# Patient Record
Sex: Female | Born: 1970 | Race: White | Hispanic: No | Smoking: Never smoker
Health system: Southern US, Community
[De-identification: ages and names within clinical notes are randomized; demographics above are authoritative.]

## PROBLEM LIST (undated history)

## (undated) DIAGNOSIS — K219 Gastro-esophageal reflux disease without esophagitis: Secondary | ICD-10-CM

## (undated) DIAGNOSIS — F329 Major depressive disorder, single episode, unspecified: Secondary | ICD-10-CM

## (undated) DIAGNOSIS — F32A Depression, unspecified: Secondary | ICD-10-CM

## (undated) DIAGNOSIS — F419 Anxiety disorder, unspecified: Secondary | ICD-10-CM

## (undated) DIAGNOSIS — Z87442 Personal history of urinary calculi: Secondary | ICD-10-CM

## (undated) HISTORY — PX: EXTRACORPOREAL SHOCK WAVE LITHOTRIPSY: SHX1557

## (undated) HISTORY — PX: CHOLECYSTECTOMY: SHX55

---

## 1998-04-13 ENCOUNTER — Other Ambulatory Visit: Admission: RE | Admit: 1998-04-13 | Discharge: 1998-04-13 | Payer: Self-pay | Admitting: Obstetrics and Gynecology

## 1999-01-23 ENCOUNTER — Inpatient Hospital Stay (HOSPITAL_COMMUNITY): Admission: AD | Admit: 1999-01-23 | Discharge: 1999-01-23 | Payer: Self-pay | Admitting: *Deleted

## 1999-01-25 ENCOUNTER — Inpatient Hospital Stay (HOSPITAL_COMMUNITY): Admission: AD | Admit: 1999-01-25 | Discharge: 1999-01-25 | Payer: Self-pay | Admitting: Obstetrics and Gynecology

## 1999-02-08 ENCOUNTER — Observation Stay (HOSPITAL_COMMUNITY): Admission: AD | Admit: 1999-02-08 | Discharge: 1999-02-08 | Payer: Self-pay | Admitting: Obstetrics and Gynecology

## 1999-02-14 ENCOUNTER — Observation Stay (HOSPITAL_COMMUNITY): Admission: AD | Admit: 1999-02-14 | Discharge: 1999-02-15 | Payer: Self-pay | Admitting: Obstetrics and Gynecology

## 1999-02-23 ENCOUNTER — Other Ambulatory Visit: Admission: RE | Admit: 1999-02-23 | Discharge: 1999-02-23 | Payer: Self-pay | Admitting: Obstetrics and Gynecology

## 1999-06-28 ENCOUNTER — Encounter (HOSPITAL_COMMUNITY): Admission: RE | Admit: 1999-06-28 | Discharge: 1999-07-11 | Payer: Self-pay | Admitting: Obstetrics and Gynecology

## 1999-07-10 ENCOUNTER — Inpatient Hospital Stay (HOSPITAL_COMMUNITY): Admission: AD | Admit: 1999-07-10 | Discharge: 1999-07-15 | Payer: Self-pay | Admitting: Obstetrics and Gynecology

## 1999-07-10 ENCOUNTER — Encounter: Payer: Self-pay | Admitting: Obstetrics and Gynecology

## 1999-07-10 ENCOUNTER — Encounter (INDEPENDENT_AMBULATORY_CARE_PROVIDER_SITE_OTHER): Payer: Self-pay

## 1999-08-16 ENCOUNTER — Other Ambulatory Visit: Admission: RE | Admit: 1999-08-16 | Discharge: 1999-08-16 | Payer: Self-pay | Admitting: Obstetrics and Gynecology

## 2001-10-18 ENCOUNTER — Other Ambulatory Visit: Admission: RE | Admit: 2001-10-18 | Discharge: 2001-10-18 | Payer: Self-pay | Admitting: Obstetrics and Gynecology

## 2004-01-01 ENCOUNTER — Observation Stay (HOSPITAL_COMMUNITY): Admission: RE | Admit: 2004-01-01 | Discharge: 2004-01-02 | Payer: Self-pay | Admitting: Surgery

## 2004-01-01 ENCOUNTER — Encounter (INDEPENDENT_AMBULATORY_CARE_PROVIDER_SITE_OTHER): Payer: Self-pay | Admitting: Specialist

## 2004-01-17 ENCOUNTER — Encounter: Payer: Self-pay | Admitting: Emergency Medicine

## 2004-01-18 ENCOUNTER — Inpatient Hospital Stay (HOSPITAL_COMMUNITY): Admission: EM | Admit: 2004-01-18 | Discharge: 2004-01-20 | Payer: Self-pay | Admitting: Surgery

## 2004-06-10 ENCOUNTER — Other Ambulatory Visit: Admission: RE | Admit: 2004-06-10 | Discharge: 2004-06-10 | Payer: Self-pay | Admitting: Obstetrics and Gynecology

## 2005-09-12 ENCOUNTER — Other Ambulatory Visit: Admission: RE | Admit: 2005-09-12 | Discharge: 2005-09-12 | Payer: Self-pay | Admitting: Obstetrics and Gynecology

## 2007-07-29 ENCOUNTER — Emergency Department (HOSPITAL_COMMUNITY): Admission: EM | Admit: 2007-07-29 | Discharge: 2007-07-29 | Payer: Self-pay | Admitting: Emergency Medicine

## 2007-07-29 ENCOUNTER — Other Ambulatory Visit: Payer: Self-pay | Admitting: Obstetrics and Gynecology

## 2007-08-15 ENCOUNTER — Ambulatory Visit (HOSPITAL_COMMUNITY): Admission: RE | Admit: 2007-08-15 | Discharge: 2007-08-15 | Payer: Self-pay | Admitting: Urology

## 2009-04-13 ENCOUNTER — Encounter: Admission: RE | Admit: 2009-04-13 | Discharge: 2009-06-07 | Payer: Self-pay | Admitting: Sports Medicine

## 2009-09-19 ENCOUNTER — Ambulatory Visit: Payer: Self-pay | Admitting: Diagnostic Radiology

## 2009-09-19 ENCOUNTER — Emergency Department (HOSPITAL_BASED_OUTPATIENT_CLINIC_OR_DEPARTMENT_OTHER): Admission: EM | Admit: 2009-09-19 | Discharge: 2009-09-19 | Payer: Self-pay | Admitting: Emergency Medicine

## 2010-11-27 LAB — DIFFERENTIAL
Basophils Absolute: 0.2 10*3/uL — ABNORMAL HIGH (ref 0.0–0.1)
Basophils Relative: 2 % — ABNORMAL HIGH (ref 0–1)
Eosinophils Relative: 2 % (ref 0–5)
Monocytes Absolute: 0.6 10*3/uL (ref 0.1–1.0)
Monocytes Relative: 5 % (ref 3–12)

## 2010-11-27 LAB — CBC
Hemoglobin: 14 g/dL (ref 12.0–15.0)
MCHC: 33.6 g/dL (ref 30.0–36.0)
RDW: 13 % (ref 11.5–15.5)
WBC: 11.9 10*3/uL — ABNORMAL HIGH (ref 4.0–10.5)

## 2010-11-27 LAB — BASIC METABOLIC PANEL
BUN: 16 mg/dL (ref 6–23)
Calcium: 8.8 mg/dL (ref 8.4–10.5)
Chloride: 105 mEq/L (ref 96–112)
Creatinine, Ser: 0.9 mg/dL (ref 0.4–1.2)
GFR calc Af Amer: >60 mL/min (ref >60)
GFR calc non Af Amer: >60 mL/min (ref >60)

## 2011-01-27 NOTE — Op Note (Signed)
NAMENARISSA, BEAUFORT                          ACCOUNT NO.:  [id number]   MEDICAL RECORD NO.:  [id number]                   PATIENT TYPE:  INP   LOCATION:  6706                                 FACILITY:  [facility]   PHYSICIAN:  Danise Edge, M.D.                DATE OF BIRTH:  1971/06/08   DATE OF PROCEDURE:  01/19/2004  DATE OF DISCHARGE:                                 OPERATIVE REPORT   PROCEDURE:  Endoscopic retrograde cholangiopancreatography with endoscopic  sphincterotomy and biliary stone removal.   PROCEDURE INDICATION:  Katherine Leon is a 40 year old female born  24-May-1971.  On January 01, 2004, Ms. Katherine Leon underwent a laparoscopic  cholecystectomy to remove a gallbladder with gallstones.  There was a  retained cystic stump stone.  Postoperatively the patient remained  asymptomatic for two weeks.  On Jan 17, 2004, she developed epigastric  abdominal pain and back pain.  Her admission white blood cell count was  normal.  Her liver enzymes were elevated.  Shortly after admission to the  hospital her pain completely resolved and has not recurred.  Her liver  enzymes are decreasing.  Her magnetic resonance cholangiogram reveals a  stone in the distal common bile duct.   MEDICATION ALLERGIES:  None.   CHRONIC MEDICATIONS:  Flonase and Aciphex.   PAST MEDICAL HISTORY:  1. Cesarean section.  2. Laparoscopic cholecystectomy.   HABITS:  Ms. Katherine Leon does not smoke cigarettes or consume alcohol.   SOCIAL HISTORY:  Ms. Katherine Leon is married.  She works for the [employer] of the  [employer].   ENDOSCOPIST:  Danise Edge, M.D.   PREMEDICATION:  Demerol 100 mg, Versed 10 mg, Glucagon 0.5 mg.   PROCEDURE:  After obtaining informed consent, Ms. Katherine Leon was placed in the  left lateral decubitus position.  I administered intravenous Demerol and  intravenous Versed to achieve conscious sedation for the procedure.  The  patient's blood pressure, oxygen saturation, and cardiac rhythm  were  monitored throughout the procedure and documented in the medical record.   The Olympus duodenoscope was passed through the posterior hypopharynx, down  the esophagus, and into the proximal stomach without examining the  esophageal mucosa.  The stomach was not examined.  A normal-appearing  pylorus was intubated.  The endoscope was advanced to the second portion of  the duodenum without examining the duodenal bulb.   Major papilla:  Endoscopically the major papilla appears normal.  She has a  very long intramural segment of her distal common bile duct.   Pancreatogram:  The pancreatic duct was cannulated and only partially filled  with contrast.  The mid-distal pancreatic duct appears normal.   Cholangiogram:  The common bile duct was selectively cannulated and a wire  placed into the intrahepatic ductal system.  A cholangiogram revealed a  filling defect in the common hepatic duct.  She has a long cystic duct stump  and there is either a filling defect or air bubble in the distal cystic  duct.   Stone removal:  An endoscopic sphincterotomy was performed.  The biliary  stone was removed with the 12 mm balloon catheter.   PLAN:  1. Clear liquids today, 3000 mL of IV saline at 200 mL/hr.  2. Observe for signs of pancreatitis.  3. Check lipase and hepatic profile tomorrow.                                               Danise Edge, M.D.    MJ/MEDQ  D:  01/19/2004  T:  01/20/2004  Job:  [id number]   cc:   Katherine Leon, M.D.  [address]  [place]  [place] [address]  Fax: [phone]

## 2011-01-27 NOTE — H&P (Signed)
Katherine Leon, Katherine Leon                          ACCOUNT NO.:  [id number]   MEDICAL RECORD NO.:  [id number]                   PATIENT TYPE:  INP   LOCATION:  NA                                   FACILITY:  [facility]   PHYSICIAN:  Angelia Mould. Derrell Lolling, M.D.             DATE OF BIRTH:  1971-08-14   DATE OF ADMISSION:  01/18/2004  DATE OF DISCHARGE:                                HISTORY & PHYSICAL   CHIEF COMPLAINT:  Abdominal pain.   HISTORY OF PRESENT ILLNESS:  This is a 40 year old white female who  underwent a laparoscopic cholecystectomy by Dr. Jamey Ripa on January 01, 2004.  She had some unusual anatomy, but the surgery was uneventful. A stone was  left in the cystic duct distally near its junction with the common bile  duct. Her liver function tests were normal at that time. She has done well  for the past two weeks. For the past 48 hours, however, she has had back  pain, epigastric pain, and nausea, but no fevers, chills, vomiting, or  diarrhea. She called today and I had her come to the emergency room. Her  liver function tests are significantly elevated. She is not having much  distress. I suspect that she has a common bile duct stone that she is trying  to pass and she is admitted to the hospital for further evaluation and  management.   PAST MEDICAL HISTORY:  1. Laparoscopic cholecystectomy, January 01, 2004.  2. Cesarean section times one.  3. Wisdom teeth removal.   CURRENT MEDICATIONS:  Aciphex and Flonase.   ALLERGIES:  No known drug allergies.   SOCIAL HISTORY:  Denies alcohol or tobacco. She is employed and works for  [employer]. She is married.   PHYSICAL EXAMINATION:  GENERAL: A pleasant young white female in minimal  distress.  VITAL SIGNS: Temperature 99.1, pulse 94, respirations 20, blood pressure  150/97.  HEENT:  Eyes reveal sclera clear.  NECK: Supple, nontender. No adenopathy. No jugular venous distention.  LUNGS: Clear to auscultation. No CVA  tenderness. No chest wall tenderness.  HEART: Regular rate and rhythm with no murmur.  ABDOMEN: Soft. Bowel sounds were hypoactive. She is not distended. She has  minimal subjective tenderness in the epigastrium. Well healed trocar scars.  EXTREMITIES: She moves all four extremities without deformity or pain.  NEUROLOGIC: No gross motor or sensory deficits.   ADMISSION LABORATORY DATA:  Hemoglobin 14.1, white blood cell count 8200,  total bilirubin 3.9, SGPT 1097, alkaline phosphatase 189, lipase 28.  Urinalysis normal.   ASSESSMENT:  1. Suspect she is trying to pass a common bile duct stone.  2. Status post laparoscopic cholecystectomy.   PLAN:  The patient will be admitted. She will be started on intravenous  fluids, intravenous antibiotics, and kept NPO. We are going to get an  ultrasound to see if that can show any further detail. Dr. Vernia Buff  Magod has  been called and he plans to see her regarding possible ERCP tomorrow.                                               Angelia Mould. Derrell Lolling, M.D.    HMI/MEDQ  D:  01/17/2004  T:  01/18/2004  Job:  [id number]   cc:   Petra Kuba, M.D.  [address]  [place]  [place] [address]  Fax: [phone]

## 2011-01-27 NOTE — Op Note (Signed)
NAMEALANTE, WEIMANN                          ACCOUNT NO.:  [id number]   MEDICAL RECORD NO.:  [id number]                   PATIENT TYPE:  AMB   LOCATION:  DAY                                  [facility]ACILITY[facility]  [facility]   PHYSICIAN:  Currie Paris, M.D.           DATE OF BIRTH:  06/07/1971   DATE OF PROCEDURE:  01/01/2004  DATE OF DISCHARGE:                                 OPERATIVE REPORT   CCS#:  [id number]   PREOPERATIVE DIAGNOSES:  Chronic calculous cholecystitis.   POSTOPERATIVE DIAGNOSES:  Chronic calculous cholecystitis.   OPERATION:  Laparoscopic cholecystectomy with operative cholangiogram.   SURGEON:  Currie Paris, M.D.   ASSISTANT:  Sheppard Plumber. Earlene Plater, M.D.   ANESTHESIA:  General endotracheal.   CLINICAL COURSE:  This patient is a 40 year old with some small stones on  gallbladder ultrasound and biliary symptoms.  After discussion with the  patient, she elected to proceed to cholecystectomy.   DESCRIPTION OF PROCEDURE:  The patient was seen in the holding area and had  no further questions. She was taken to the operating room and after  satisfactory general anesthesia had been obtained, the abdomen was prepped  and draped.  Marcaine 0.25% plain was used for each incision. The umbilical  incision was made, the fascia opened and the peritoneal cavity entered under  direct vision.  A pursestring was placed, the Hasson introduced and the  abdomen insufflated to 15.   With the patient in reverse Trendelenburg and tilted to the left, two 5 mm  trocars were placed under direct vision in the right side and a 10/11 in the  mid epigastrium.   The gallbladder was noted to have some omental adhesions which were taken  down.  The gallbladder was relatively decompressed and somewhat contracted.  As we tried to identify where the cystic gallbladder junction was, it was  difficult to see because of the contraction and there was just a very long  narrow tubular area going down from  the gallbladder ports to the common  duct.  With continued retraction on the gallbladder, I was able to open up  the peritoneum anteriorly and posteriorly and this lower part of the  gallbladder was really on the mesentery so I was able to get a window behind  the gallbladder clearly up about a third of the way up the gallbladder.  I  then divided some of the peritoneal attachments coming down, identified some  small blood vessels which were clipped and divided until I got down to where  I had I had what looked like a thick wall in the cystic duct.  I stopped at  this point and I put a clip on what I though was the cystic gallbladder  junction. Further down I could see what appeared to be the common duct but  initially did not dissect further down into this area.   Once the cystic duct was opened,  I tried to milk some bile out but got just  a little droplet.  We used a Reddick catheter and after several attempts I  was able to get it in and hold __________ in place but it was difficult to  get flow through.  We went ahead with the cholangiogram and we saw filling  of the distal duct and question of a filling defect.  However, I did not get  any good filling of the hepatic ducts. The duodenum did fill.   At this point, we went back in with the camera, got a 30 degree scope so you  could see down a little bit further because we were just having a little  difficulty with visualization.  I dissected the cystic duct further down,  clearly could see the common duct where the cystic and common joined.  With  further dissection of the cystic duct, I was able then to somewhat better  advance the cystic duct catheter for cholangiography, replace the clip and I  blew up the balloon a little bit.   We repeat the cholangiogram and this time got better flow and better  visualization.  What we appeared to have was a fairly long somewhat dilated  cystic duct with what appeared to be a stone in it at the  junction of the  cystic and common but flow of contrast around that into the common duct,  down into the duodenum and then back up the common hepatic duct and into the  right and left hepatic radicles.  The common hepatic and common bile ducts  did not appear dilated and there was no obstruction but the cystic duct did  appear dilated and I think that she had a chronically impacted stone with  chronic inflammatory changes of the cystic duct and gallbladder.   Once this anatomy was clarified, I went ahead and tried to find the distal  cystic duct and I think there was a little bit of a common channel.  We  tried to see if there was a stone we could milk back but could not really  identify one.  I had the cystic duct all cleaned off so I put three clips on  it and divided it.   The remaining peritoneal attachments to the gallbladder and the liver were  divided and held with the cautery, the gallbladder placed in a bag. We  irrigated and made sure everything was dry and checked all of our clips and  they all appeared to be okay and then brought the gallbladder out the  umbilical port.   We irrigated one last time and then removed the lateral ports. There was no  bleeding.  The umbilical port was removed and the pursestring tied down to  close that. The abdomen was deflated through the epigastric port.  The skin  was closed with 4-0 Monocryl subcuticular and Dermabond.   The patient tolerated the procedure well. There were no operative  complications.  All counts were correct.                                               Currie Paris, M.D.    CJS/MEDQ  D:  01/01/2004  T:  01/01/2004  Job:  [id number]   cc:   Shanda Bumps, [facility]  [place], [place]

## 2011-06-20 LAB — URINALYSIS, ROUTINE W REFLEX MICROSCOPIC
Ketones, ur: 15 — AB
Leukocytes, UA: NEGATIVE
Nitrite: NEGATIVE
Protein, ur: NEGATIVE
Specific Gravity, Urine: >1.03 — ABNORMAL HIGH
pH: 5.5

## 2011-06-20 LAB — URINE MICROSCOPIC-ADD ON: WBC, UA: NONE SEEN

## 2011-06-20 LAB — BASIC METABOLIC PANEL
CO2: 27
Calcium: 8.9
Creatinine, Ser: 1.07
GFR calc non Af Amer: 59 — ABNORMAL LOW

## 2011-06-20 LAB — POCT PREGNANCY, URINE
Operator id: 25114
Preg Test, Ur: NEGATIVE

## 2013-01-29 ENCOUNTER — Other Ambulatory Visit: Payer: Self-pay | Admitting: Obstetrics and Gynecology

## 2013-04-01 ENCOUNTER — Other Ambulatory Visit: Payer: Self-pay | Admitting: Obstetrics and Gynecology

## 2013-04-01 DIAGNOSIS — R928 Other abnormal and inconclusive findings on diagnostic imaging of breast: Secondary | ICD-10-CM

## 2013-04-16 ENCOUNTER — Ambulatory Visit
Admission: RE | Admit: 2013-04-16 | Discharge: 2013-04-16 | Disposition: A | Discharge status: Home / Self Care | Payer: BC Managed Care – PPO | Source: Ambulatory Visit | Attending: Obstetrics and Gynecology | Admitting: Obstetrics and Gynecology

## 2013-04-16 DIAGNOSIS — R928 Other abnormal and inconclusive findings on diagnostic imaging of breast: Secondary | ICD-10-CM

## 2014-03-23 DIAGNOSIS — Z719 Counseling, unspecified: Secondary | ICD-10-CM | POA: Insufficient documentation

## 2014-03-23 DIAGNOSIS — F330 Major depressive disorder, recurrent, mild: Secondary | ICD-10-CM | POA: Insufficient documentation

## 2014-03-23 DIAGNOSIS — F329 Major depressive disorder, single episode, unspecified: Secondary | ICD-10-CM | POA: Insufficient documentation

## 2014-03-23 DIAGNOSIS — J301 Allergic rhinitis due to pollen: Secondary | ICD-10-CM | POA: Insufficient documentation

## 2014-03-23 DIAGNOSIS — K219 Gastro-esophageal reflux disease without esophagitis: Secondary | ICD-10-CM | POA: Insufficient documentation

## 2014-03-23 DIAGNOSIS — F5102 Adjustment insomnia: Secondary | ICD-10-CM | POA: Insufficient documentation

## 2014-03-23 DIAGNOSIS — J309 Allergic rhinitis, unspecified: Secondary | ICD-10-CM | POA: Insufficient documentation

## 2014-03-23 DIAGNOSIS — M12269 Villonodular synovitis (pigmented), unspecified knee: Secondary | ICD-10-CM | POA: Insufficient documentation

## 2016-01-04 DIAGNOSIS — Z87442 Personal history of urinary calculi: Secondary | ICD-10-CM | POA: Insufficient documentation

## 2016-09-29 DIAGNOSIS — Z8619 Personal history of other infectious and parasitic diseases: Secondary | ICD-10-CM | POA: Insufficient documentation

## 2016-11-02 ENCOUNTER — Other Ambulatory Visit: Payer: Self-pay | Admitting: Gastroenterology

## 2016-11-02 DIAGNOSIS — R1013 Epigastric pain: Secondary | ICD-10-CM

## 2016-11-06 ENCOUNTER — Other Ambulatory Visit: Payer: Self-pay | Admitting: Gastroenterology

## 2016-11-06 DIAGNOSIS — R1013 Epigastric pain: Secondary | ICD-10-CM

## 2016-11-09 ENCOUNTER — Inpatient Hospital Stay
Admission: RE | Admit: 2016-11-09 | Discharge: 2016-11-09 | Disposition: A | Discharge status: Home / Self Care | Payer: BC Managed Care – PPO | Source: Ambulatory Visit | Attending: Gastroenterology | Admitting: Gastroenterology

## 2016-11-10 ENCOUNTER — Other Ambulatory Visit: Payer: BC Managed Care – PPO

## 2016-11-14 ENCOUNTER — Ambulatory Visit
Admission: RE | Admit: 2016-11-14 | Discharge: 2016-11-14 | Disposition: A | Discharge status: Home / Self Care | Payer: BC Managed Care – PPO | Source: Ambulatory Visit | Attending: Gastroenterology | Admitting: Gastroenterology

## 2016-11-14 DIAGNOSIS — R1013 Epigastric pain: Secondary | ICD-10-CM

## 2016-11-17 ENCOUNTER — Other Ambulatory Visit: Payer: Self-pay | Admitting: Gastroenterology

## 2016-11-29 ENCOUNTER — Encounter (HOSPITAL_COMMUNITY): Payer: Self-pay | Admitting: *Deleted

## 2016-12-04 ENCOUNTER — Encounter (HOSPITAL_COMMUNITY)
Admission: RE | Disposition: A | Discharge status: Home / Self Care | Payer: Self-pay | Source: Ambulatory Visit | Attending: Gastroenterology

## 2016-12-04 ENCOUNTER — Ambulatory Visit (HOSPITAL_COMMUNITY): Payer: BC Managed Care – PPO | Admitting: Registered Nurse

## 2016-12-04 ENCOUNTER — Ambulatory Visit (HOSPITAL_COMMUNITY)
Admission: RE | Admit: 2016-12-04 | Discharge: 2016-12-04 | Disposition: A | Discharge status: Home / Self Care | Payer: BC Managed Care – PPO | Source: Ambulatory Visit | Attending: Gastroenterology | Admitting: Gastroenterology

## 2016-12-04 ENCOUNTER — Encounter (HOSPITAL_COMMUNITY): Payer: Self-pay | Admitting: *Deleted

## 2016-12-04 DIAGNOSIS — F329 Major depressive disorder, single episode, unspecified: Secondary | ICD-10-CM | POA: Diagnosis not present

## 2016-12-04 DIAGNOSIS — Z9049 Acquired absence of other specified parts of digestive tract: Secondary | ICD-10-CM | POA: Diagnosis not present

## 2016-12-04 DIAGNOSIS — K219 Gastro-esophageal reflux disease without esophagitis: Secondary | ICD-10-CM | POA: Diagnosis not present

## 2016-12-04 DIAGNOSIS — Z79899 Other long term (current) drug therapy: Secondary | ICD-10-CM | POA: Diagnosis not present

## 2016-12-04 DIAGNOSIS — R1013 Epigastric pain: Secondary | ICD-10-CM | POA: Insufficient documentation

## 2016-12-04 HISTORY — DX: Anxiety disorder, unspecified: F41.9

## 2016-12-04 HISTORY — DX: Personal history of urinary calculi: Z87.442

## 2016-12-04 HISTORY — DX: Gastro-esophageal reflux disease without esophagitis: K21.9

## 2016-12-04 HISTORY — DX: Depression, unspecified: F32.A

## 2016-12-04 HISTORY — DX: Major depressive disorder, single episode, unspecified: F32.9

## 2016-12-04 HISTORY — PX: ESOPHAGOGASTRODUODENOSCOPY (EGD) WITH PROPOFOL: SHX5813

## 2016-12-04 SURGERY — ESOPHAGOGASTRODUODENOSCOPY (EGD) WITH PROPOFOL
Anesthesia: Monitor Anesthesia Care

## 2016-12-04 MED ORDER — PROPOFOL 10 MG/ML IV BOLUS
INTRAVENOUS | Status: AC
  Filled 2016-12-04: qty 40

## 2016-12-04 MED ORDER — GLYCOPYRROLATE 0.2 MG/ML IV SOSY
PREFILLED_SYRINGE | INTRAVENOUS | Status: AC
  Filled 2016-12-04: qty 5

## 2016-12-04 MED ORDER — LACTATED RINGERS IV SOLN
INTRAVENOUS | Status: DC | PRN
  Administered 2016-12-04: 08:00:00 via INTRAVENOUS

## 2016-12-04 MED ORDER — LIDOCAINE HCL (CARDIAC) 20 MG/ML IV SOLN
INTRAVENOUS | Status: DC | PRN
  Administered 2016-12-04: 100 mg via INTRAVENOUS

## 2016-12-04 MED ORDER — PROPOFOL 500 MG/50ML IV EMUL
INTRAVENOUS | Status: DC | PRN
  Administered 2016-12-04: 300 ug/kg/min via INTRAVENOUS

## 2016-12-04 MED ORDER — LIDOCAINE 2% (20 MG/ML) 5 ML SYRINGE
INTRAMUSCULAR | Status: AC
  Filled 2016-12-04: qty 5

## 2016-12-04 MED ORDER — LACTATED RINGERS IV SOLN
INTRAVENOUS | Status: DC
  Administered 2016-12-04: 08:00:00 via INTRAVENOUS

## 2016-12-04 MED ORDER — GLYCOPYRROLATE 0.2 MG/ML IJ SOLN
INTRAMUSCULAR | Status: DC | PRN
  Administered 2016-12-04: 0.2 mg via INTRAVENOUS

## 2016-12-04 SURGICAL SUPPLY — 15 items

## 2016-12-04 NOTE — Anesthesia Preprocedure Evaluation (Signed)
Anesthesia Evaluation  Patient identified by MRN, date of birth, ID band Patient awake    Reviewed: Allergy & Precautions, NPO status , Patient's Chart, lab work & pertinent test results  Airway Mallampati: II  TM Distance: >3 FB Neck ROM: Full    Dental no notable dental hx.    Pulmonary neg pulmonary ROS,    Pulmonary exam normal breath sounds clear to auscultation       Cardiovascular negative cardio ROS Normal cardiovascular exam Rhythm:Regular Rate:Normal     Neuro/Psych negative neurological ROS  negative psych ROS   GI/Hepatic negative GI ROS, Neg liver ROS, GERD  ,  Endo/Other  negative endocrine ROS  Renal/GU negative Renal ROS  negative genitourinary   Musculoskeletal negative musculoskeletal ROS (+)   Abdominal   Peds negative pediatric ROS (+)  Hematology negative hematology ROS (+)   Anesthesia Other Findings   Reproductive/Obstetrics negative OB ROS                             Anesthesia Physical Anesthesia Plan  ASA: II  Anesthesia Plan: MAC   Post-op Pain Management:    Induction: Intravenous  Airway Management Planned: Nasal Cannula  Additional Equipment:   Intra-op Plan:   Post-operative Plan:   Informed Consent: I have reviewed the patients History and Physical, chart, labs and discussed the procedure including the risks, benefits and alternatives for the proposed anesthesia with the patient or authorized representative who has indicated his/her understanding and acceptance.   Dental advisory given  Plan Discussed with: CRNA  Anesthesia Plan Comments:         Anesthesia Quick Evaluation

## 2016-12-04 NOTE — H&P (Signed)
Problem: Epigastric pain radiating through to her back. December 2017 H pylori gastritis treated  History: The patient is a 46 year old female born [birth year]. On 01/01/2004 she underwent laparoscopic cholecystectomy with operative cholangiogram. On 01/19/2004 she underwent an endoscopic retrograde cholangiopancreatography with endoscopic sphincterotomy to remove a common bile duct stone.  The patient is having episodic epigastric and retroxiphoid pain that radiates through to her back but is not associated with heartburn, dysphagia, or odynophagia. Her pain is not related to meals or fasting. She experienced similar pain leading to her laparoscopic cholecystectomy.  MRCP was not approved by her insurance company. On 11/14/2016 she underwent an abdominal ultrasound which showed a 1 cm lower pole right kidney cyst but no other abnormalities.  The patient is scheduled to undergo diagnostic esophagogastroduodenoscopy with screen for H. pylori gastritis.  Medication allergies: None  Past medical history: Nontoxic multinodular goiter. Kidney stones. Allergic rhinitis. Depression. Treated H. pylori gastritis. Cesarean section. Laparoscopic cholecystectomy. ERCP with biliary sphincterotomy and common bile duct stone removal performed on 01/19/2004  Exam: Patient is alert and lying comfortably on the endoscopy stretcher. Abdomen is soft and nontender to palpation. Lungs are clear to auscultation. Cardiac exam reveals a regular rhythm.  Plan: Proceed with diagnostic esophagogastroduodenoscopy with screen for H. pylori gastritis.

## 2016-12-04 NOTE — Anesthesia Postprocedure Evaluation (Signed)
Anesthesia Post Note  Patient: Katherine Leon  Procedure(s) Performed: Procedure(s) (LRB): ESOPHAGOGASTRODUODENOSCOPY (EGD) WITH PROPOFOL (N/A)  Patient location during evaluation: PACU Anesthesia Type: MAC Level of consciousness: awake and alert Pain management: pain level controlled Vital Signs Assessment: post-procedure vital signs reviewed and stable Respiratory status: spontaneous breathing, nonlabored ventilation and respiratory function stable Cardiovascular status: stable and blood pressure returned to baseline Anesthetic complications: no       Last Vitals:  Vitals:   12/04/16 0845 12/04/16 0857  BP: (!) 157/94 (!) 151/87  Pulse: 97 85  Resp: (!) 21 17  Temp:      Last Pain:  Vitals:   12/04/16 0836  TempSrc: Oral                 Lynda Rainwater

## 2016-12-04 NOTE — Transfer of Care (Signed)
Immediate Anesthesia Transfer of Care Note  Patient: Katherine Leon  Procedure(s) Performed: Procedure(s): ESOPHAGOGASTRODUODENOSCOPY (EGD) WITH PROPOFOL (N/A)  Patient Location: PACU  Anesthesia Type:MAC  Level of Consciousness: awake, alert , oriented and patient cooperative  Airway & Oxygen Therapy: Patient Spontanous Breathing and Patient connected to nasal cannula oxygen  Post-op Assessment: Report given to RN, Post -op Vital signs reviewed and stable and Patient moving all extremities X 4  Post vital signs: stable  Last Vitals:  Vitals:   12/04/16 0730 12/04/16 0836  BP: (!) 157/84 (!) 141/91  Pulse: 94   Resp: 16 15  Temp: 36.8 C 36.9 C    Last Pain:  Vitals:   12/04/16 0836  TempSrc: Oral         Complications: No apparent anesthesia complications

## 2016-12-04 NOTE — Discharge Instructions (Signed)

## 2016-12-04 NOTE — Op Note (Signed)
[facility] Patient Name: Katherine Leon Procedure Date: 12/04/2016 MRN: [id number] Attending MD: Charolett Bumpers , MD Date of Birth: 1971/07/29 CSN: [id number] Age: 46 Admit Type: Outpatient Procedure:                Upper GI endoscopy Indications:              Epigastric abdominal pain. 01/01/2004 laparoscopic                            cholecystectomy with intraoperative cholangiogram                            was performed. 01/19/2004 ERCP with biliary                            sphincterotomy to remove common bile duct stone was                            performed. December 2017 H pylori gastritis was                            treated. Providers:                Charolett Bumpers, MD, Tillie Fantasia, RN, Beryle Beams, Technician, Anastasio Champion, CRNA Referring MD:              Medicines:                Propofol per Anesthesia Complications:            No immediate complications. Estimated Blood Loss:     Estimated blood loss was minimal. Procedure:                Pre-Anesthesia Assessment:                           - Prior to the procedure, a History and Physical                            was performed, and patient medications and                            allergies were reviewed. The patient's tolerance of                            previous anesthesia was also reviewed. The risks                            and benefits of the procedure and the sedation                            options and risks were discussed with the patient.                            All questions  were answered, and informed consent                            was obtained. Prior Anticoagulants: The patient has                            taken no previous anticoagulant or antiplatelet                            agents. ASA Grade Assessment: I - A normal, healthy                            patient. After reviewing the risks and benefits,   the patient was deemed in satisfactory condition to                            undergo the procedure.                           After obtaining informed consent, the endoscope was                            passed under direct vision. Throughout the                            procedure, the patient's blood pressure, pulse, and                            oxygen saturations were monitored continuously. The                            Endoscope was introduced through the mouth, and                            advanced to the second part of duodenum. The upper                            GI endoscopy was accomplished without difficulty.                            The patient tolerated the procedure well. Scope In: Scope Out: Findings:      The Z-line was regular and was found 38 cm from the incisors.      The examined esophagus was normal.      The entire examined stomach was normal except for scattered less than 5       mm benign fundic gland appearing polyps in the gastric fundus. Biopsies       were taken with a cold forceps for histology.      The examined duodenum was normal. Impression:               - Z-line regular, 38 cm from the incisors.                           - Normal esophagus.                           -  Normal stomach. Biopsied.                           - Normal examined duodenum. Moderate Sedation:      N/A- Per Anesthesia Care Recommendation:           - Patient has a contact number available for                            emergencies. The signs and symptoms of potential                            delayed complications were discussed with the                            patient. Return to normal activities tomorrow.                            Written discharge instructions were provided to the                            patient.                           - Await pathology results. If gastric biopsy                            pathology is normal, refer the patient for                             endoscopic ultrasound of the biliary tract.                           - Resume previous diet.                           - Continue present medications. Procedure Code(s):        --- Professional ---                           [id number], Esophagogastroduodenoscopy, flexible,                            transoral; with biopsy, single or multiple Diagnosis Code(s):        --- Professional ---                           R10.13, Epigastric pain CPT copyright 2016 [employer]. All rights reserved. The codes documented in this report are preliminary and upon coder review may  be revised to meet current compliance requirements. Danise Edge, MD Charolett Bumpers, MD 12/04/2016 8:37:04 AM This report has been signed electronically. Number of Addenda: 0

## 2016-12-05 ENCOUNTER — Encounter (HOSPITAL_COMMUNITY): Payer: Self-pay | Admitting: Gastroenterology

## 2017-04-20 DIAGNOSIS — E041 Nontoxic single thyroid nodule: Secondary | ICD-10-CM | POA: Insufficient documentation

## 2017-07-16 ENCOUNTER — Encounter: Payer: Self-pay | Admitting: Neurology

## 2017-07-16 ENCOUNTER — Ambulatory Visit: Payer: BC Managed Care – PPO | Admitting: Neurology

## 2017-07-16 ENCOUNTER — Encounter (INDEPENDENT_AMBULATORY_CARE_PROVIDER_SITE_OTHER): Payer: Self-pay

## 2017-07-16 VITALS — BP 139/91 | HR 85 | Ht 64.0 in | Wt 194.0 lb

## 2017-07-16 DIAGNOSIS — G8929 Other chronic pain: Secondary | ICD-10-CM

## 2017-07-16 DIAGNOSIS — H905 Unspecified sensorineural hearing loss: Secondary | ICD-10-CM | POA: Diagnosis not present

## 2017-07-16 DIAGNOSIS — R519 Headache, unspecified: Secondary | ICD-10-CM

## 2017-07-16 DIAGNOSIS — R51 Headache with orthostatic component, not elsewhere classified: Secondary | ICD-10-CM

## 2017-07-16 DIAGNOSIS — G44229 Chronic tension-type headache, not intractable: Secondary | ICD-10-CM | POA: Insufficient documentation

## 2017-07-16 DIAGNOSIS — H539 Unspecified visual disturbance: Secondary | ICD-10-CM

## 2017-07-16 DIAGNOSIS — H9190 Unspecified hearing loss, unspecified ear: Secondary | ICD-10-CM

## 2017-07-16 DIAGNOSIS — R42 Dizziness and giddiness: Secondary | ICD-10-CM

## 2017-07-16 MED ORDER — METHYLPREDNISOLONE 4 MG PO TBPK: ORAL_TABLET | ORAL | 1 refills | Status: DC

## 2017-07-16 MED ORDER — AZITHROMYCIN 250 MG PO TABS: ORAL_TABLET | ORAL | 0 refills | Status: DC

## 2017-07-16 NOTE — Patient Instructions (Addendum)
MRI brain w/wo contrast Labs today Sleep evaluation (will call) Physical Therapy (will call) If PT does not help in a few weeks, email me and we can proceed with MRI cervical spine and/or emg/ncs [facility] [phone]  To prevent or relieve headaches, try the following: Cool Compress. Lie down and place a cool compress on your head.  Avoid headache triggers. If certain foods or odors seem to have triggered your migraines in the past, avoid them. A headache diary might help you identify triggers.  Include physical activity in your daily routine. Try a daily walk or other moderate aerobic exercise.  Manage stress. Find healthy ways to cope with the stressors, such as delegating tasks on your to-do list.  Practice relaxation techniques. Try deep breathing, yoga, massage and visualization.  Eat regularly. Eating regularly scheduled meals and maintaining a healthy diet might help prevent headaches. Also, drink plenty of fluids.  Follow a regular sleep schedule. Sleep deprivation might contribute to headaches Consider biofeedback. With this mind-body technique, you learn to control certain bodily functions - such as muscle tension, heart rate and blood pressure - to prevent headaches or reduce headache pain.    Proceed to emergency room if you experience new or worsening symptoms or symptoms do not resolve, if you have new neurologic symptoms or if headache is severe, or for any concerning symptom.   Azithromycin tablets What is this medicine? AZITHROMYCIN (az ith roe MYE sin) is a macrolide antibiotic. It is used to treat or prevent certain kinds of bacterial infections. It will not work for colds, flu, or other viral infections. This medicine may be used for other purposes; ask your health care provider or pharmacist if you have questions. COMMON BRAND NAME(S): Zithromax, Zithromax Tri-Pak, Zithromax Z-Pak What should I tell my health care provider before  I take this medicine? They need to know if you have any of these conditions: -kidney disease -liver disease -irregular heartbeat or heart disease -an unusual or allergic reaction to azithromycin, erythromycin, other macrolide antibiotics, foods, dyes, or preservatives -pregnant or trying to get pregnant -breast-feeding How should I use this medicine? Take this medicine by mouth with a full glass of water. Follow the directions on the prescription label. The tablets can be taken with food or on an empty stomach. If the medicine upsets your stomach, take it with food. Take your medicine at regular intervals. Do not take your medicine more often than directed. Take all of your medicine as directed even if you think your are better. Do not skip doses or stop your medicine early. Talk to your pediatrician regarding the use of this medicine in children. While this drug may be prescribed for children as young as 6 months for selected conditions, precautions do apply. Overdosage: If you think you have taken too much of this medicine contact a poison control center or emergency room at once. NOTE: This medicine is only for you. Do not share this medicine with others. What if I miss a dose? If you miss a dose, take it as soon as you can. If it is almost time for your next dose, take only that dose. Do not take double or extra doses. What may interact with this medicine? Do not take this medicine with any of the following medications: -lincomycin This medicine may also interact with the following medications: -amiodarone -antacids -birth control pills -cyclosporine -digoxin -magnesium -nelfinavir -phenytoin -warfarin This list may not describe all possible interactions. Give your  health care provider a list of all the medicines, herbs, non-prescription drugs, or dietary supplements you use. Also tell them if you smoke, drink alcohol, or use illegal drugs. Some items may interact with your  medicine. What should I watch for while using this medicine? Tell your doctor or healthcare professional if your symptoms do not start to get better or if they get worse. Do not treat diarrhea with over the counter products. Contact your doctor if you have diarrhea that lasts more than 2 days or if it is severe and watery. This medicine can make you more sensitive to the sun. Keep out of the sun. If you cannot avoid being in the sun, wear protective clothing and use sunscreen. Do not use sun lamps or tanning beds/booths. What side effects may I notice from receiving this medicine? Side effects that you should report to your doctor or health care professional as soon as possible: -allergic reactions like skin rash, itching or hives, swelling of the face, lips, or tongue -confusion, nightmares or hallucinations -dark urine -difficulty breathing -hearing loss -irregular heartbeat or chest pain -pain or difficulty passing urine -redness, blistering, peeling or loosening of the skin, including inside the mouth -white patches or sores in the mouth -yellowing of the eyes or skin Side effects that usually do not require medical attention (report to your doctor or health care professional if they continue or are bothersome): -diarrhea -dizziness, drowsiness -headache -stomach upset or vomiting -tooth discoloration -vaginal irritation This list may not describe all possible side effects. Call your doctor for medical advice about side effects. You may report side effects to FDA at 1-800-FDA-1088. Where should I keep my medicine? Keep out of the reach of children. Store at room temperature between 15 and 30 degrees C (59 and 86 degrees F). Throw away any unused medicine after the expiration date. NOTE: This sheet is a summary. It may not cover all possible information. If you have questions about this medicine, talk to your doctor, pharmacist, or health care provider.  2018 [employer]vier/Gold Standard  (2015-10-26 15:26:03)  Methylprednisolone tablets What is this medicine? METHYLPREDNISOLONE (meth ill pred NISS oh lone) is a corticosteroid. It is commonly used to treat inflammation of the skin, joints, lungs, and other organs. Common conditions treated include asthma, allergies, and arthritis. It is also used for other conditions, such as blood disorders and diseases of the adrenal glands. This medicine may be used for other purposes; ask your health care provider or pharmacist if you have questions. COMMON BRAND NAME(S): Medrol, Medrol Dosepak What should I tell my health care provider before I take this medicine? They need to know if you have any of these conditions: -Cushing's syndrome -eye disease, vision problems -diabetes -glaucoma -heart disease -high blood pressure -infection (especially a virus infection such as chickenpox, cold sores, or herpes) -liver disease -mental illness -myasthenia gravis -osteoporosis -recently received or scheduled to receive a vaccine -seizures -stomach or intestine problems -thyroid disease -an unusual or allergic reaction to lactose, methylprednisolone, other medicines, foods, dyes, or preservatives -pregnant or trying to get pregnant -breast-feeding How should I use this medicine? Take this medicine by mouth with a glass of water. Follow the directions on the prescription label. Take this medicine with food. If you are taking this medicine once a day, take it in the morning. Do not take it more often than directed. Do not suddenly stop taking your medicine because you may develop a severe reaction. Your doctor will tell you how much medicine  to take. If your doctor wants you to stop the medicine, the dose may be slowly lowered over time to avoid any side effects. Talk to your pediatrician regarding the use of this medicine in children. Special care may be needed. Overdosage: If you think you have taken too much of this medicine contact a poison  control center or emergency room at once. NOTE: This medicine is only for you. Do not share this medicine with others. What if I miss a dose? If you miss a dose, take it as soon as you can. If it is almost time for your next dose, talk to your doctor or health care professional. You may need to miss a dose or take an extra dose. Do not take double or extra doses without advice. What may interact with this medicine? Do not take this medicine with any of the following medications: -alefacept -echinacea -live virus vaccines -metyrapone -mifepristone This medicine may also interact with the following medications: -amphotericin B -aspirin and aspirin-like medicines -certain antibiotics like erythromycin, clarithromycin, troleandomycin -certain medicines for diabetes -certain medicines for fungal infections like ketoconazole -certain medicines for seizures like carbamazepine, phenobarbital, phenytoin -certain medicines that treat or prevent blood clots like warfarin -cholestyramine -cyclosporine -digoxin -diuretics -female hormones, like estrogens and birth control pills -isoniazid -NSAIDs, medicines for pain inflammation, like ibuprofen or naproxen -other medicines for myasthenia gravis -rifampin -vaccines This list may not describe all possible interactions. Give your health care provider a list of all the medicines, herbs, non-prescription drugs, or dietary supplements you use. Also tell them if you smoke, drink alcohol, or use illegal drugs. Some items may interact with your medicine. What should I watch for while using this medicine? Tell your doctor or healthcare professional if your symptoms do not start to get better or if they get worse. Do not stop taking except on your doctor's advice. You may develop a severe reaction. Your doctor will tell you how much medicine to take. This medicine may increase your risk of getting an infection. Tell your doctor or health care professional if  you are around anyone with measles or chickenpox, or if you develop sores or blisters that do not heal properly. This medicine may affect blood sugar levels. If you have diabetes, check with your doctor or health care professional before you change your diet or the dose of your diabetic medicine. Tell your doctor or health care professional right away if you have any change in your eyesight. Using this medicine for a long time may increase your risk of low bone mass. Talk to your doctor about bone health. What side effects may I notice from receiving this medicine? Side effects that you should report to your doctor or health care professional as soon as possible: -allergic reactions like skin rash, itching or hives, swelling of the face, lips, or tongue -bloody or tarry stools -changes in vision -hallucination, loss of contact with reality -muscle cramps -muscle pain -palpitations -signs and symptoms of high blood sugar such as dizziness; dry mouth; dry skin; fruity breath; nausea; stomach pain; increased hunger or thirst; increased urination -signs and symptoms of infection like fever or chills; cough; sore throat; pain or trouble passing urine -trouble passing urine or change in the amount of urine Side effects that usually do not require medical attention (report to your doctor or health care professional if they continue or are bothersome): -changes in emotions or mood -constipation -diarrhea -excessive hair growth on the face or body -headache -nausea, vomiting -trouble  sleeping -weight gain This list may not describe all possible side effects. Call your doctor for medical advice about side effects. You may report side effects to FDA at 1-800-FDA-1088. Where should I keep my medicine? Keep out of the reach of children. Store at room temperature between 20 and 25 degrees C (68 and 77 degrees F). Throw away any unused medicine after the expiration date. NOTE: This sheet is a summary.  It may not cover all possible information. If you have questions about this medicine, talk to your doctor, pharmacist, or health care provider.  2018 Elsevier/Gold Standard (2015-11-04 15:53:30)  Topiramate extended-release capsules What is this medicine? TOPIRAMATE (toe PYRE a mate) is used to treat seizures in adults or children with epilepsy. It is also used for the prevention of migraine headaches. This medicine may be used for other purposes; ask your health care provider or pharmacist if you have questions. COMMON BRAND NAME(S): Trokendi XR What should I tell my health care provider before I take this medicine? They need to know if you have any of these conditions: -cirrhosis of the liver or liver disease -diarrhea -glaucoma -kidney stones or kidney disease -lung disease like asthma, obstructive pulmonary disease, emphysema -metabolic acidosis -on a ketogenic diet -scheduled for surgery or a procedure -suicidal thoughts, plans, or attempt; a previous suicide attempt by you or a family member -an unusual or allergic reaction to topiramate, other medicines, foods, dyes, or preservatives -pregnant or trying to get pregnant -breast-feeding How should I use this medicine? Take this medicine by mouth with a glass of water. Follow the directions on the prescription label. Trokendi XR capsules must be swallowed whole. Do not sprinkle on food, break, crush, dissolve, or chew. Qudexy XR capsules may be swallowed whole or opened and sprinkled on a small amount of soft food. This mixture must be swallowed immediately. Do not chew or store mixture for later use. You may take this medicine with meals. Take your medicine at regular intervals. Do not take it more often than directed. Talk to your pediatrician regarding the use of this medicine in children. Special care may be needed. While Trokendi XR may be prescribed for children as young as 6 years and Qudexy XR may be prescribed for children as  young as 2 years for selected conditions, precautions do apply. Overdosage: If you think you have taken too much of this medicine contact a poison control center or emergency room at once. NOTE: This medicine is only for you. Do not share this medicine with others. What if I miss a dose? If you miss a dose, take it as soon as you can. If it is almost time for your next dose, take only that dose. Do not take double or extra doses. What may interact with this medicine? Do not take this medicine with any of the following medications: -probenecid This medicine may also interact with the following medications: -acetazolamide -alcohol -amitriptyline -birth control pills -digoxin -hydrochlorothiazide -lithium -medicines for pain, sleep, or muscle relaxation -metformin -methazolamide -other seizure or epilepsy medicines -pioglitazone -risperidone This list may not describe all possible interactions. Give your health care provider a list of all the medicines, herbs, non-prescription drugs, or dietary supplements you use. Also tell them if you smoke, drink alcohol, or use illegal drugs. Some items may interact with your medicine. What should I watch for while using this medicine? Visit your doctor or health care professional for regular checks on your progress. Do not stop taking this medicine suddenly. This  increases the risk of seizures if you are using this medicine to control epilepsy. Wear a medical identification bracelet or chain to say you have epilepsy or seizures, and carry a card that lists all your medicines. This medicine can decrease sweating and increase your body temperature. Watch for signs of deceased sweating or fever, especially in children. Avoid extreme heat, hot baths, and saunas. Be careful about exercising, especially in hot weather. Contact your health care provider right away if you notice a fever or decrease in sweating. You should drink plenty of fluids while taking this  medicine. If you have had kidney stones in the past, this will help to reduce your chances of forming kidney stones. If you have stomach pain, with nausea or vomiting and yellowing of your eyes or skin, call your doctor immediately. You may get drowsy, dizzy, or have blurred vision. Do not drive, use machinery, or do anything that needs mental alertness until you know how this medicine affects you. To reduce dizziness, do not sit or stand up quickly, especially if you are an older patient. Alcohol can increase drowsiness and dizziness. Avoid alcoholic drinks. Do not drink alcohol for 6 hours before or 6 hours after taking Trokendi XR. If you notice blurred vision, eye pain, or other eye problems, seek medical attention at once for an eye exam. The use of this medicine may increase the chance of suicidal thoughts or actions. Pay special attention to how you are responding while on this medicine. Any worsening of mood, or thoughts of suicide or dying should be reported to your health care professional right away. This medicine may increase the chance of developing metabolic acidosis. If left untreated, this can cause kidney stones, bone disease, or slowed growth in children. Symptoms include breathing fast, fatigue, loss of appetite, irregular heartbeat, or loss of consciousness. Call your doctor immediately if you experience any of these side effects. Also, tell your doctor about any surgery you plan on having while taking this medicine since this may increase your risk for metabolic acidosis. Birth control pills may not work properly while you are taking this medicine. Talk to your doctor about using an extra method of birth control. Women who become pregnant while using this medicine may enroll in the [place] [employer] Antiepileptic Drug Pregnancy Registry by calling [phone]. This registry collects information about the safety of antiepileptic drug use during pregnancy. What side effects may I notice  from receiving this medicine? Side effects that you should report to your doctor or health care professional as soon as possible: -allergic reactions like skin rash, itching or hives, swelling of the face, lips, or tongue -decreased sweating and/or rise in body temperature -depression -difficulty breathing, fast or irregular breathing patterns -difficulty speaking -difficulty walking or controlling muscle movements -hearing impairment -redness, blistering, peeling or loosening of the skin, including inside the mouth -tingling, pain or numbness in the hands or feet -unusually weak or tired -worsening of mood, thoughts or actions of suicide or dying Side effects that usually do not require medical attention (report to your doctor or health care professional if they continue or are bothersome): -altered taste -back pain, joint or muscle aches and pains -diarrhea, or constipation -headache -loss of appetite -nausea -stomach upset, indigestion -tremors This list may not describe all possible side effects. Call your doctor for medical advice about side effects. You may report side effects to FDA at 1-800-FDA-1088. Where should I keep my medicine? Keep out of the reach of children. Store at  room temperature between 15 and 30 degrees C (59 and 86 degrees F) in a tightly closed container. Protect from moisture. Throw away any unused medicine after the expiration date. NOTE: This sheet is a summary. It may not cover all possible information. If you have questions about this medicine, talk to your doctor, pharmacist, or health care provider.  2018 [employer]vier/Gold Standard (2015-12-17 12:33:11)

## 2017-07-16 NOTE — Progress Notes (Signed)
[facility]    Provider:  Dr Lucia Gaskins Referring Provider: Bernerd Pho* Primary Care Physician:  Bailey Mech, PA-C  CC: Chronic daily intractable headache  HPI:  Katherine Leon is a 46 y.o. female here as a referral from Dr. Joellyn Quails for headache, Started end of June this year, she went to [place] in July and when she got off the plane she started feeling like she was on a boat. Her ears are very sensitive to sound, they don't feel right, she has a lot of Sinus pain on the right side, pain underneath the right eye, a lot of blood when she blows her nose. She has no history of migraines, but her brother had migraines. The headaches are in the temples, pressure, No light sensitivity, she has nausea, not pulsating, she has it every day, she wakes up with headaches. Doesn't know if she snores. She is excessively fatigued, she could nod off a lot, she has pain in her neck and radiates down the arm to the right arm. Lots of stress in her life. She has lots of financial issues currently. She does not get restful sleep, wakes up feeling tired. The headache is all the time, it is nagging currently low loevel, can be a 6-7/10, she can function, sleeping and rest helps. Most of the time it is low level. Stress can make it work. She also has radiating pain She has vision changes, she can't see when she wakes up.   Reviewed notes, labs and imaging from outside physicians, which showed:  Reviewed notes from primary care.  Patient was diagnosed with new daily persistent headache meclizine 5 mg 3 times daily as needed as ordered steroid course sleep study eval for obstructive sleep apnea chief complaint was headache and dizziness.  Headache started in July 2018 worsening over the course of associated weakness, speech changes, she has some nausea and trouble focusing.  Headache is contributed to difficulty sleeping and daily her thyroid studies have been normal tried  over-the-counter blood pressure does endorse unsteadiness which is general but no vertigo.  She has slight gait unsteadiness, she has been staying hydrated.   TSH in August 2018 was normal at 1.22    Review of Systems: Patient complains of symptoms per HPI as well as the following symptoms: fatigue, decreased energy. Pertinent negatives and positives per HPI. All others negative.   Social History   Socioeconomic History  . Marital status: Divorced    Spouse name: Not on file  . Number of children: Not on file  . Years of education: Not on file  . Highest education level: Not on file  Social Needs  . Financial resource strain: Not on file  . Food insecurity - worry: Not on file  . Food insecurity - inability: Not on file  . Transportation needs - medical: Not on file  . Transportation needs - non-medical: Not on file  Occupational History  . Not on file  Tobacco Use  . Smoking status: Never Smoker  . Smokeless tobacco: Never Used  Substance and Sexual Activity  . Alcohol use: No  . Drug use: No  . Sexual activity: Yes  Other Topics Concern  . Not on file  Social History Narrative  . Not on file    Family History  Problem Relation Age of Onset  . Migraines Brother     Past Medical History:  Diagnosis Date  . Anxiety   . Depression   . GERD (gastroesophageal reflux disease)   .  History of kidney stones     Past Surgical History:  Procedure Laterality Date  . CESAREAN SECTION     x 1  . CHOLECYSTECTOMY    . EXTRACORPOREAL SHOCK WAVE LITHOTRIPSY     x 1    Current Outpatient Medications  Medication Sig Dispense Refill  . cetirizine (ZYRTEC) 10 MG tablet Take 10 mg daily by mouth.    . fluocinonide cream (LIDEX) 0.05 % Apply 1 application topically daily as needed (eczema).    . fluticasone (FLONASE) 50 MCG/ACT nasal spray Place 2 sprays into both nostrils daily as needed for allergies or rhinitis.    Marland Kitchen Ketotifen Fumarate (ALAWAY OP) Apply 1 drop to eye  daily as needed (allergies).    . LORazepam (ATIVAN PO) Take 0.25 mg as needed by mouth.    Marland Kitchen PARAGARD INTRAUTERINE COPPER IUD IUD 1 each by Intrauterine route once. Inserted 2 years ago    . traZODone (DESYREL) 150 MG tablet Take 150 mg by mouth at bedtime.    Marland Kitchen azithromycin (ZITHROMAX) 250 MG tablet Take 2 pills on first day then take one daily 6 each 0  . methylPREDNISolone (MEDROL DOSEPAK) 4 MG TBPK tablet Take pills daily together in the morning with food over 6 days 21 tablet 1   No current facility-administered medications for this visit.     Allergies as of 07/16/2017  . (No Known Allergies)    Vitals: BP (!) 139/91   Pulse 85   Ht 5\' 4"  (1.626 m)   Wt 194 lb (88 kg)   BMI 33.30 kg/m  Last Weight:  Wt Readings from Last 1 Encounters:  07/16/17 194 lb (88 kg)   Last Height:   Ht Readings from Last 1 Encounters:  07/16/17 5\' 4"  (1.626 m)    Physical exam: Exam: Gen: NAD, conversant, well nourised, obese, well groomed                     CV: RRR, no MRG. No Carotid Bruits. No peripheral edema, warm, nontender Eyes: Conjunctivae clear without exudates or hemorrhage  Neuro: Detailed Neurologic Exam  Speech:    Speech is normal; fluent and spontaneous with normal comprehension.  Cognition:    The patient is oriented to person, place, and time;     recent and remote memory intact;     language fluent;     normal attention, concentration,     fund of knowledge Cranial Nerves:    The pupils are equal, round, and reactive to light. The fundi are normal and spontaneous venous pulsations are present. Visual fields are full to finger confrontation. Extraocular movements are intact. Trigeminal sensation is intact and the muscles of mastication are normal. The face is symmetric. The palate elevates in the midline. Hearing intact. Voice is normal. Shoulder shrug is normal. The tongue has normal motion without fasciculations.   Coordination:    Normal finger to nose and heel  to shin. Normal rapid alternating movements.   Gait:    Heel-toe and tandem gait are normal.   Motor Observation:    No asymmetry, no atrophy, and no involuntary movements noted. Tone:    Normal muscle tone.    Posture:    Posture is normal. normal erect    Strength:    Strength is V/V in the upper and lower limbs.      Sensation: intact to LT     Reflex Exam:  DTR's:    Deep tendon reflexes in the upper  and lower extremities are normal bilaterally.   Toes:    The toes are downgoing bilaterally.   Clonus:    Clonus is absent.      Assessment/Plan: This is a patient of 46 years old with new onset chronic intractable headaches that are positional in nature with associated dizziness, vision changes, hearing changes.   MRI of the brain due to new worsening headache that is positional in quality to evaluate for space-occupying lesions, tumors, intracranial hypertension or other etiologies. Labs today. Excessive daytime fatigue, snoring, morning headache, weight gain: Needs sleep study for obstructive sleep apnea Right arm cervical radiculopathy: Physical Therapy. If no improvement MRI of the cervical spine to eval for surgical or  Obesity: [facility], discussed Possible maxillary sinusitis: azithromycin and steroid taper Patient will email me in 2-4 weeks to let me know how PT is going, if no improvement then will proceed with MRI cervical If MRI brain unremarkable with proceed with headache ppx likely Topiramate while awaiting sleep evaluation. Discussed side effects of Topiramate.  Orders Placed This Encounter  Procedures  . MR BRAIN W WO CONTRAST  . CBC  . Comprehensive metabolic panel  . Ambulatory referral to Sleep Studies  . Ambulatory referral to Physical Therapy     Discussed: To prevent or relieve headaches, try the following: Cool Compress. Lie down and place a cool compress on your head.  Avoid headache triggers. If certain foods  or odors seem to have triggered your migraines in the past, avoid them. A headache diary might help you identify triggers.  Include physical activity in your daily routine. Try a daily walk or other moderate aerobic exercise.  Manage stress. Find healthy ways to cope with the stressors, such as delegating tasks on your to-do list.  Practice relaxation techniques. Try deep breathing, yoga, massage and visualization.  Eat regularly. Eating regularly scheduled meals and maintaining a healthy diet might help prevent headaches. Also, drink plenty of fluids.  Follow a regular sleep schedule. Sleep deprivation might contribute to headaches Consider biofeedback. With this mind-body technique, you learn to control certain bodily functions - such as muscle tension, heart rate and blood pressure - to prevent headaches or reduce headache pain.    Proceed to emergency room if you experience new or worsening symptoms or symptoms do not resolve, if you have new neurologic symptoms or if headache is severe, or for any concerning symptom.   Provided education and documentation from [employer] toolbox including articles on: chronic migraine medication overuse headache, chronic migraines, prevention of migraines, behavioral and other nonpharmacologic treatments for headache.  Cc: Bailey MechPodraza, Cole Christopher, PA-C  Naomie DeanAntonia , MD  [facility] [address] [place], [place] [id number]  Phone [phone] Fax [phone]

## 2017-07-17 ENCOUNTER — Telehealth: Payer: Self-pay | Admitting: *Deleted

## 2017-07-17 LAB — CBC
Hematocrit: 43.4 % (ref 34.0–46.6)
Hemoglobin: 14 g/dL (ref 11.1–15.9)
MCH: 28.4 pg (ref 26.6–33.0)
MCHC: 32.3 g/dL (ref 31.5–35.7)
MCV: 88 fL (ref 79–97)
Platelets: 287 x10E3/uL (ref 150–379)
RBC: 4.93 x10E6/uL (ref 3.77–5.28)
RDW: 15.2 % (ref 12.3–15.4)
WBC: 10.4 x10E3/uL (ref 3.4–10.8)

## 2017-07-17 LAB — COMPREHENSIVE METABOLIC PANEL
ALT: 17 IU/L (ref 0–32)
AST: 14 IU/L (ref 0–40)
Albumin/Globulin Ratio: 1.6 (ref 1.2–2.2)
Albumin: 4.2 g/dL (ref 3.5–5.5)
Alkaline Phosphatase: 85 IU/L (ref 39–117)
BUN / CREAT RATIO: 12 (ref 9–23)
BUN: 11 mg/dL (ref 6–24)
Bilirubin Total: <0.2 mg/dL (ref 0.0–1.2)
CALCIUM: 9.2 mg/dL (ref 8.7–10.2)
CO2: 22 mmol/L (ref 20–29)
CREATININE: 0.92 mg/dL (ref 0.57–1.00)
Chloride: 104 mmol/L (ref 96–106)
GFR calc Af Amer: 88 mL/min/{1.73_m2} (ref >59)
GFR calc non Af Amer: 76 mL/min/{1.73_m2} (ref >59)
Globulin, Total: 2.6 g/dL (ref 1.5–4.5)
Glucose: 71 mg/dL (ref 65–99)
Potassium: 4.8 mmol/L (ref 3.5–5.2)
SODIUM: 140 mmol/L (ref 134–144)
TOTAL PROTEIN: 6.8 g/dL (ref 6.0–8.5)

## 2017-07-17 NOTE — Telephone Encounter (Signed)
Called patient and informed her that her labs are normal. She verbalized understanding and appreciation.

## 2017-07-17 NOTE — Telephone Encounter (Signed)
-----   Message from Anson FretAntonia B Ahern, MD sent at 07/17/2017  7:48 AM EST ----- Labs normal thanks

## 2017-07-25 ENCOUNTER — Ambulatory Visit: Payer: BC Managed Care – PPO

## 2017-07-25 DIAGNOSIS — R519 Headache, unspecified: Secondary | ICD-10-CM

## 2017-07-25 DIAGNOSIS — H9190 Unspecified hearing loss, unspecified ear: Secondary | ICD-10-CM

## 2017-07-25 DIAGNOSIS — R51 Headache with orthostatic component, not elsewhere classified: Secondary | ICD-10-CM

## 2017-07-25 DIAGNOSIS — H539 Unspecified visual disturbance: Secondary | ICD-10-CM | POA: Diagnosis not present

## 2017-07-25 DIAGNOSIS — H905 Unspecified sensorineural hearing loss: Secondary | ICD-10-CM

## 2017-07-25 DIAGNOSIS — R42 Dizziness and giddiness: Secondary | ICD-10-CM | POA: Diagnosis not present

## 2017-07-25 MED ORDER — GADOPENTETATE DIMEGLUMINE 469.01 MG/ML IV SOLN: 18.0000 mL | Freq: Once | INTRAVENOUS | Status: AC | PRN

## 2017-07-31 ENCOUNTER — Telehealth: Payer: Self-pay | Admitting: *Deleted

## 2017-07-31 NOTE — Telephone Encounter (Signed)
Called and LVM (ok per DPR) informing patient that her MRI of her brain is normal. The message did not require a call back but if she has any questions, I left the office number on the message.

## 2017-07-31 NOTE — Telephone Encounter (Signed)
-----   Message from Anson FretAntonia B Ahern, MD sent at 07/29/2017  8:39 PM EST ----- MRI brain normal

## 2017-08-01 ENCOUNTER — Encounter: Payer: Self-pay | Admitting: Neurology

## 2017-08-07 ENCOUNTER — Encounter: Payer: Self-pay | Admitting: Neurology

## 2017-09-13 ENCOUNTER — Institutional Professional Consult (permissible substitution): Payer: BC Managed Care – PPO | Admitting: Neurology

## 2018-10-18 DIAGNOSIS — E7800 Pure hypercholesterolemia, unspecified: Secondary | ICD-10-CM | POA: Insufficient documentation

## 2018-10-18 DIAGNOSIS — N289 Disorder of kidney and ureter, unspecified: Secondary | ICD-10-CM | POA: Insufficient documentation

## 2019-06-23 ENCOUNTER — Other Ambulatory Visit: Payer: Self-pay | Admitting: Obstetrics and Gynecology

## 2019-06-23 DIAGNOSIS — R928 Other abnormal and inconclusive findings on diagnostic imaging of breast: Secondary | ICD-10-CM

## 2019-06-25 ENCOUNTER — Other Ambulatory Visit: Payer: Self-pay

## 2019-06-25 ENCOUNTER — Ambulatory Visit
Admission: RE | Admit: 2019-06-25 | Discharge: 2019-06-25 | Disposition: A | Discharge status: Home / Self Care | Payer: BC Managed Care – PPO | Source: Ambulatory Visit | Attending: Obstetrics and Gynecology | Admitting: Obstetrics and Gynecology

## 2019-06-25 DIAGNOSIS — R928 Other abnormal and inconclusive findings on diagnostic imaging of breast: Secondary | ICD-10-CM

## 2020-03-25 IMAGING — MG MM DIGITAL DIAGNOSTIC UNILAT*R* W/ TOMO W/ CAD
4 series · 4 of 12 positions shown · non-contrast
Comparison: Previous exam(s).

CLINICAL DATA: 47-year-old female for further evaluation of RIGHT
breast mass identified on screening mammogram.

EXAM:
DIGITAL DIAGNOSTIC RIGHT MAMMOGRAM WITH CAD AND TOMO
ULTRASOUND RIGHT BREAST

[R ML synth-2D]
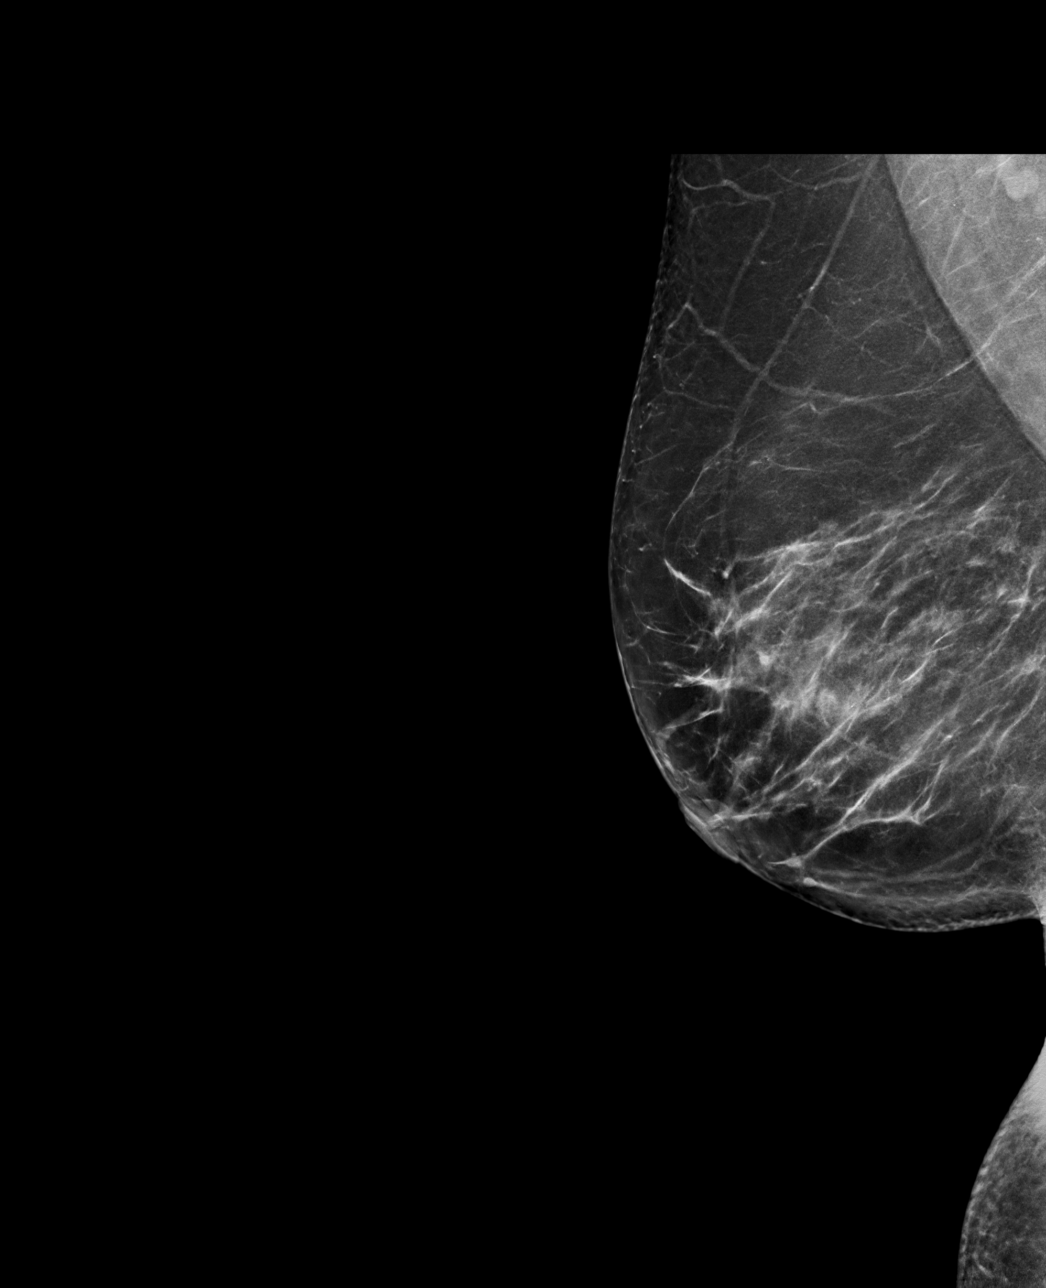

[R MLO synth-2D]
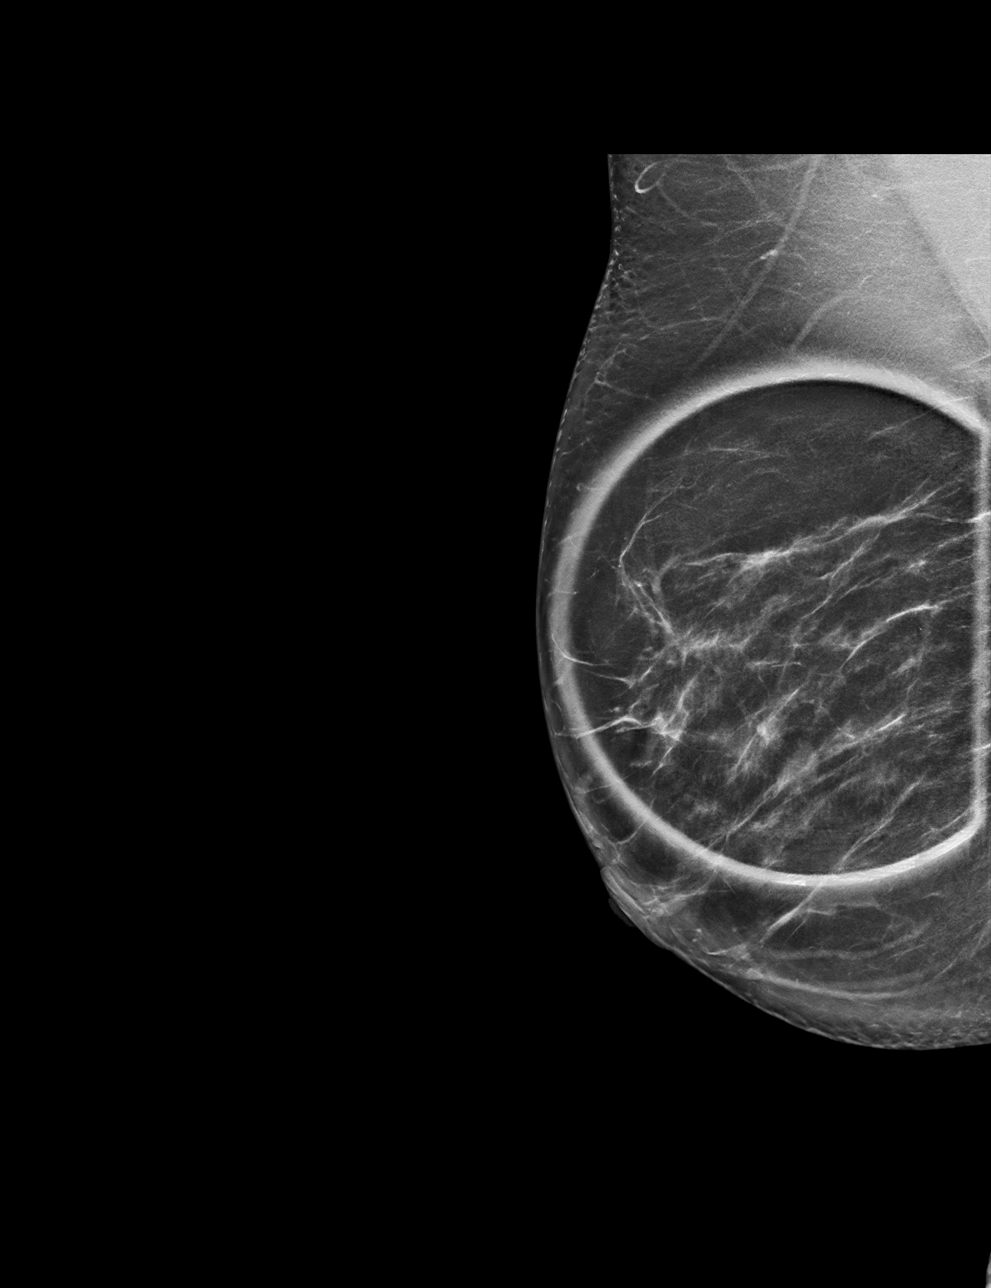

[R MLO tomo · tomo slice 41/82.0]
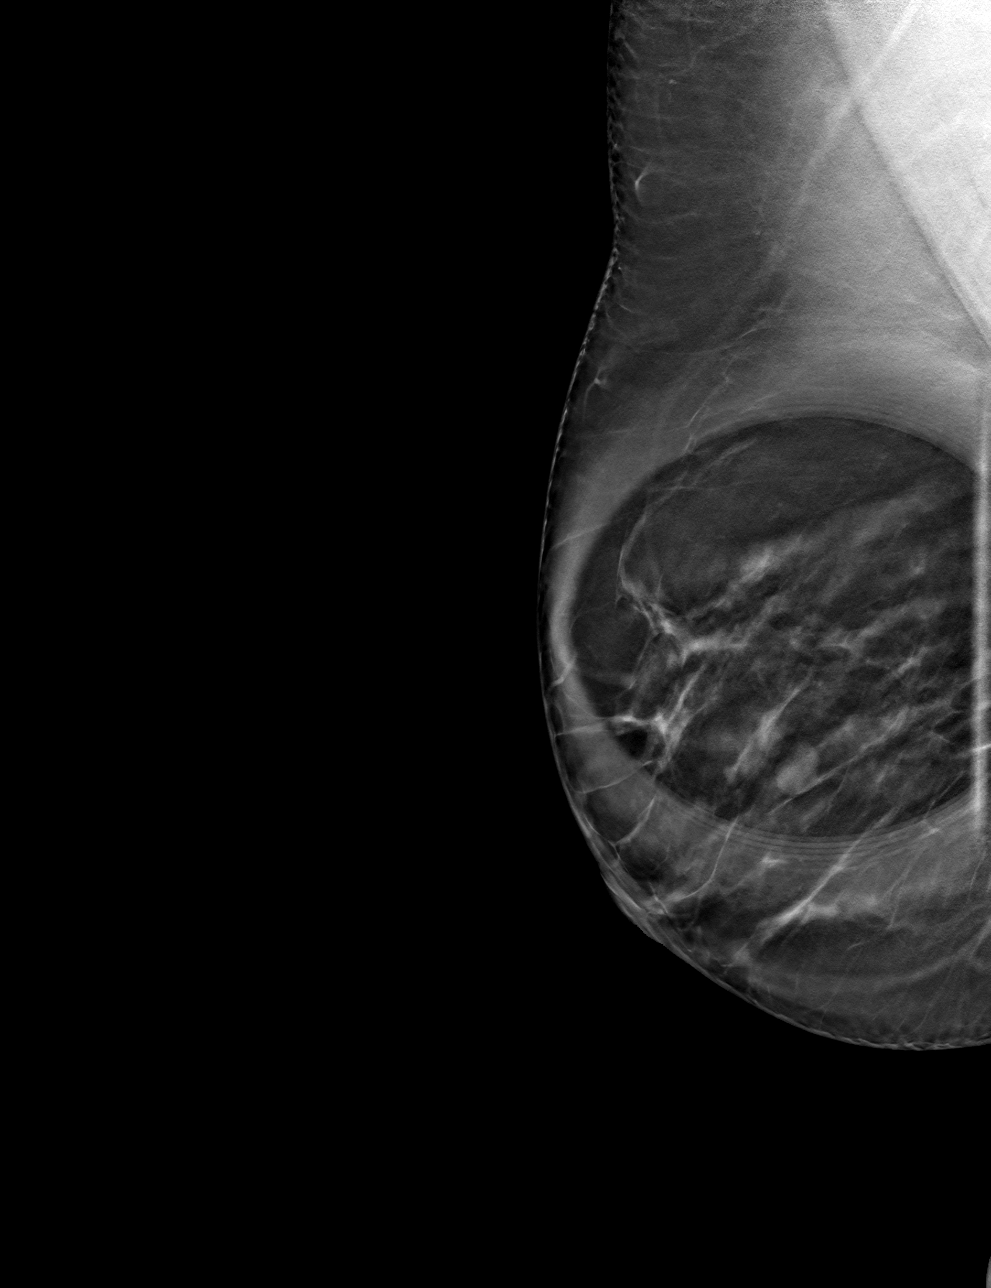

[R ML tomo · tomo slice 45/88.0]
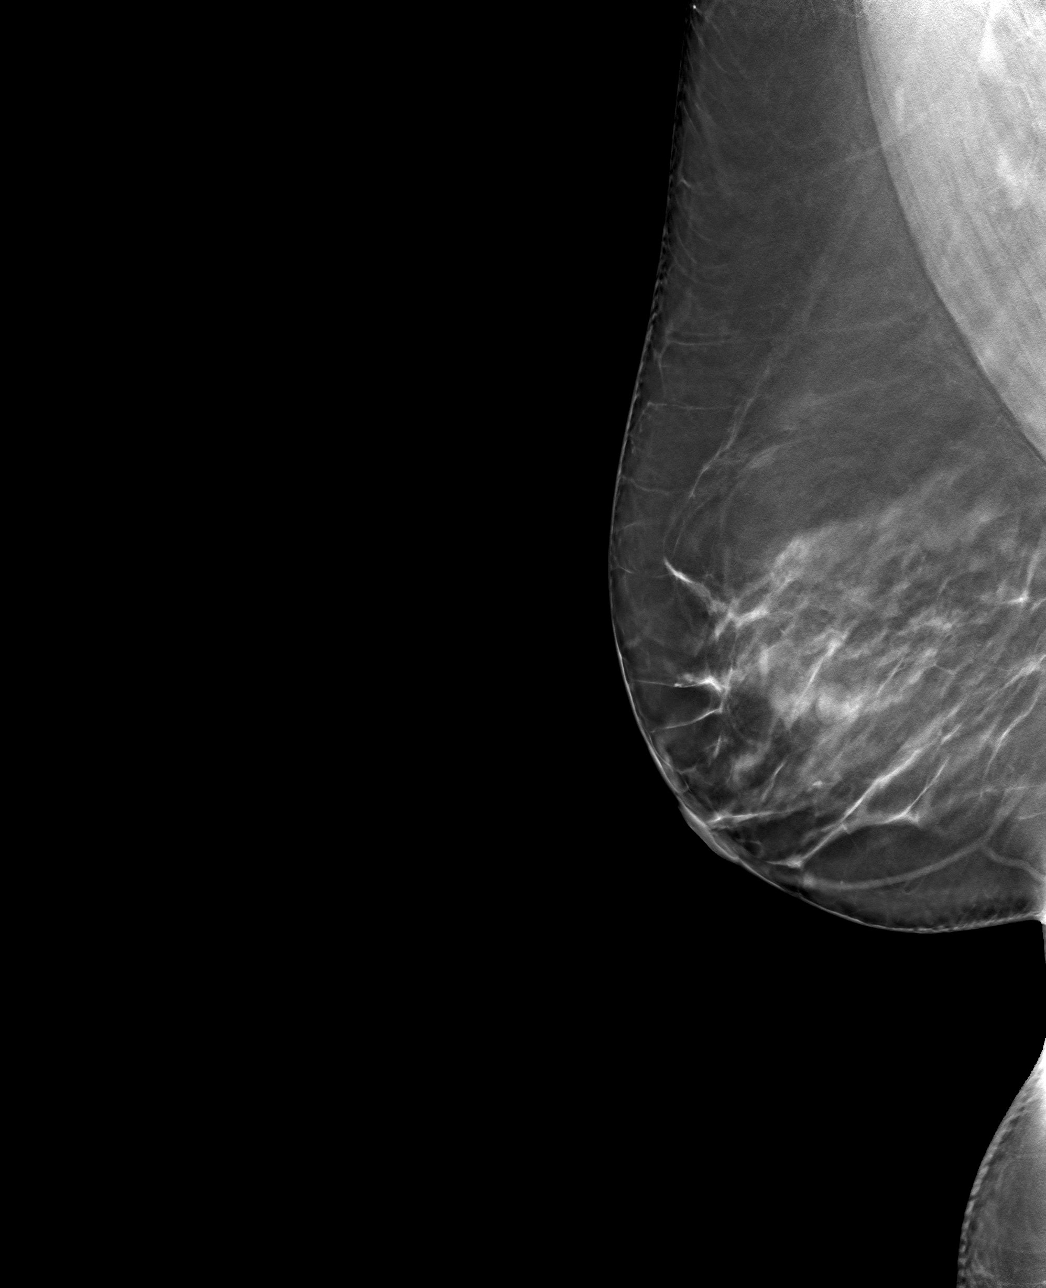

[4 of 12 positions shown; findings below may reference images not displayed]

ACR Breast Density Category c: The breast tissue is heterogeneously
dense, which may obscure small masses.
FINDINGS: 2D/3D full field and spot compression views of the RIGHT breast
demonstrate a persistent circumscribed oval mass within the UPPER
RIGHT breast.

Mammographic images were processed with CAD.

Targeted ultrasound is performed, showing a 0.8 x 0.5 x 0.6 cm
benign simple cyst at the 12 o'clock position of the RIGHT breast 1
cm from the nipple, corresponding to the screening study finding.
IMPRESSION: Benign cyst within the UPPER RIGHT breast corresponding to the
screening study finding.

RECOMMENDATION:
Bilateral screening mammogram in 1 year.

I have discussed the findings and recommendations with the patient.
If applicable, a reminder letter will be sent to the patient
regarding the next appointment.

BI-RADS CATEGORY  2: Benign.

## 2021-03-10 DIAGNOSIS — F411 Generalized anxiety disorder: Secondary | ICD-10-CM | POA: Insufficient documentation

## 2021-03-10 DIAGNOSIS — G8929 Other chronic pain: Secondary | ICD-10-CM | POA: Insufficient documentation

## 2021-08-03 ENCOUNTER — Other Ambulatory Visit: Payer: Self-pay

## 2021-08-03 ENCOUNTER — Ambulatory Visit
Admission: RE | Admit: 2021-08-03 | Discharge: 2021-08-03 | Disposition: A | Discharge status: Home / Self Care | Payer: BC Managed Care – PPO | Source: Ambulatory Visit | Attending: Obstetrics and Gynecology | Admitting: Obstetrics and Gynecology

## 2021-08-03 ENCOUNTER — Other Ambulatory Visit: Payer: Self-pay | Admitting: Obstetrics and Gynecology

## 2021-08-03 DIAGNOSIS — R102 Pelvic and perineal pain: Secondary | ICD-10-CM

## 2021-08-03 MED ORDER — IOPAMIDOL (ISOVUE-300) INJECTION 61%
100.0000 mL | Freq: Once | INTRAVENOUS | Status: AC | PRN
  Administered 2021-08-03: 100 mL via INTRAVENOUS

## 2022-05-04 IMAGING — CT CT PELVIS W/ CM
1 series · 16 of 32 positions shown, 20 images · IV contrast (APPLIED)
Comparison: 08/12/2007 from [HOSPITAL]

CLINICAL DATA: Pelvic and perineal pain.  Bloating.  Nausea.

EXAM:
CT PELVIS WITH CONTRAST
TECHNIQUE: Multidetector CT imaging of the pelvis was performed using the
standard protocol following the bolus administration of intravenous
contrast.
CONTRAST:  100mL N36M7I-PRR IOPAMIDOL (N36M7I-PRR) INJECTION 61%

[Series 2: routine pelvis w/cm · axial · 0.85mm/px · z∈[+522,+762]mm · 16 of 54 slices shown, 20 images]
[im 4/54  soft-tissue]
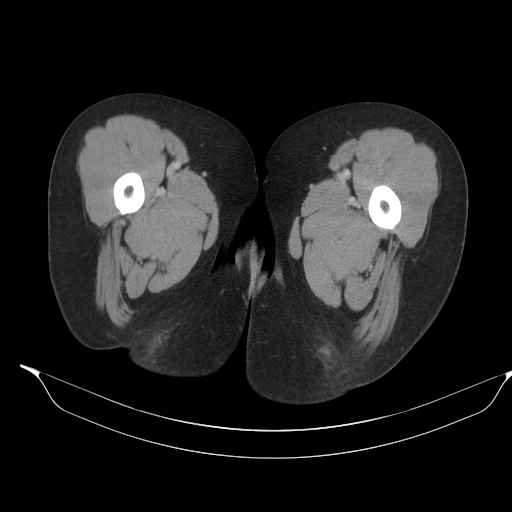
[im 4/54  bone]
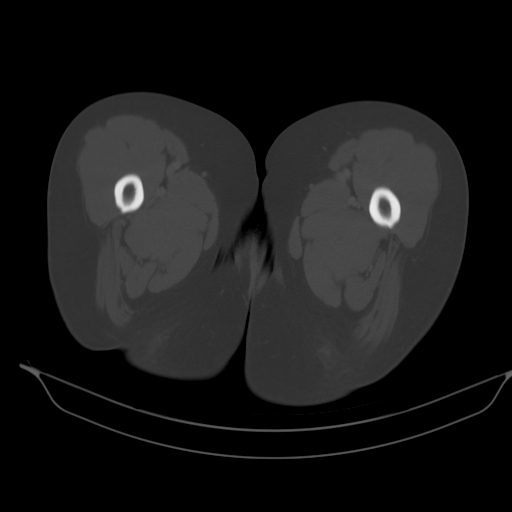
[im 7/54  soft-tissue]
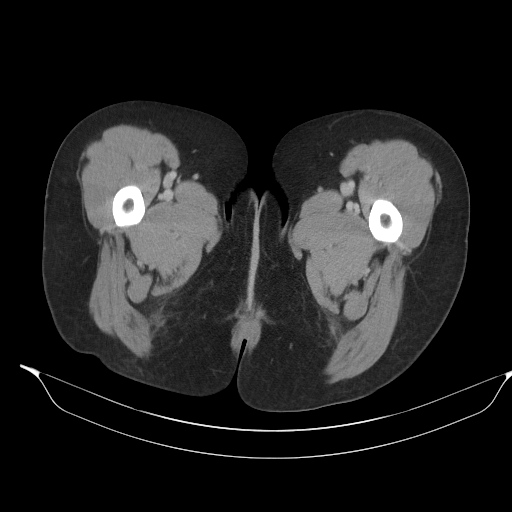
[im 11/54  soft-tissue]
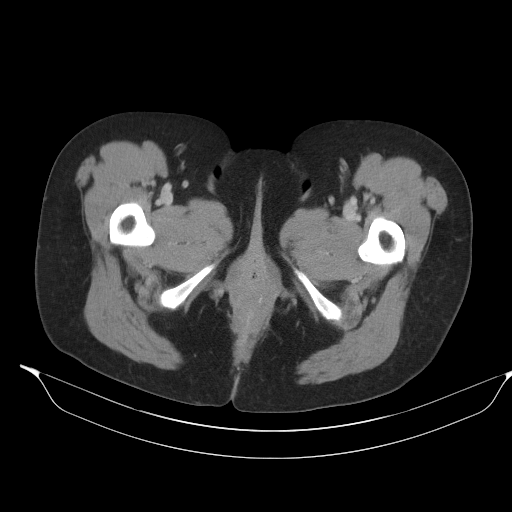
[im 14/54  soft-tissue]
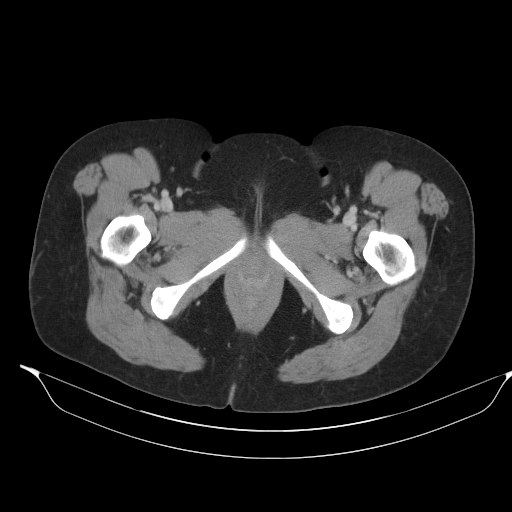
[im 18/54  soft-tissue]
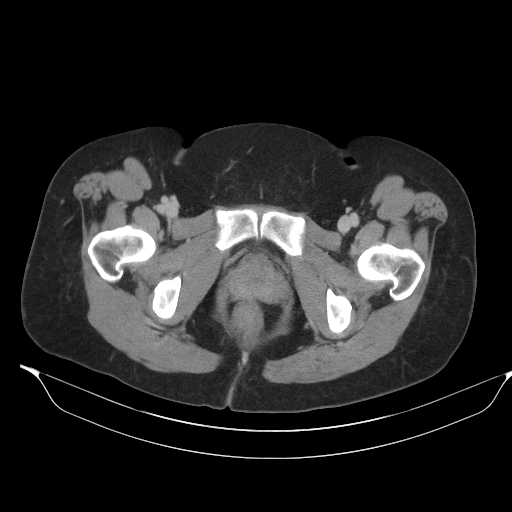
[im 21/54  soft-tissue]
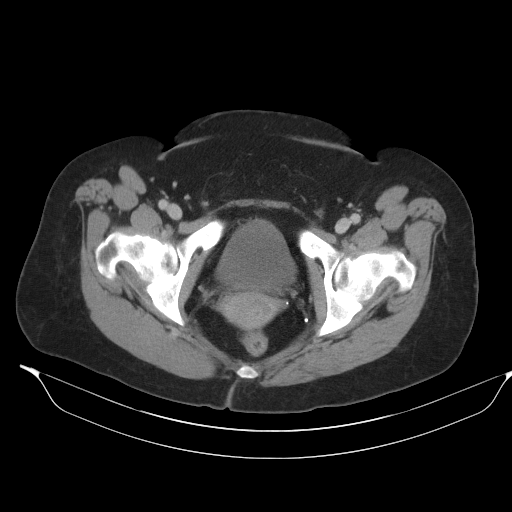
[im 24/54  soft-tissue]
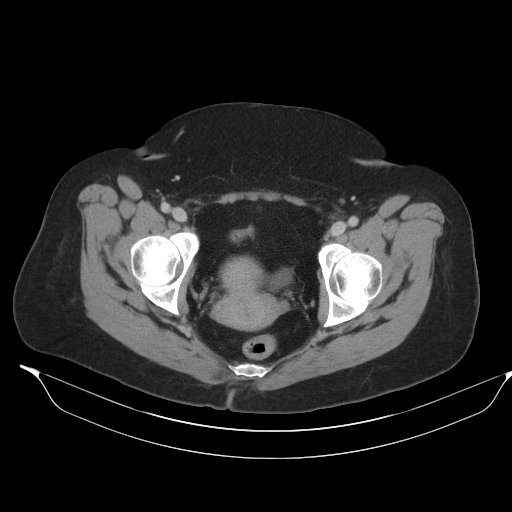
[im 30/54  soft-tissue]
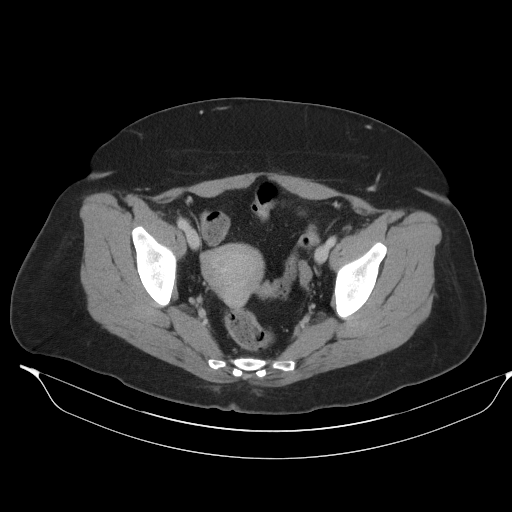
[im 33/54  soft-tissue]
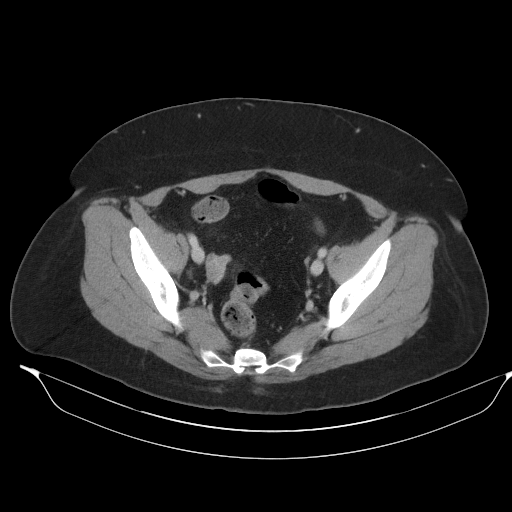
[im 33/54  bone]
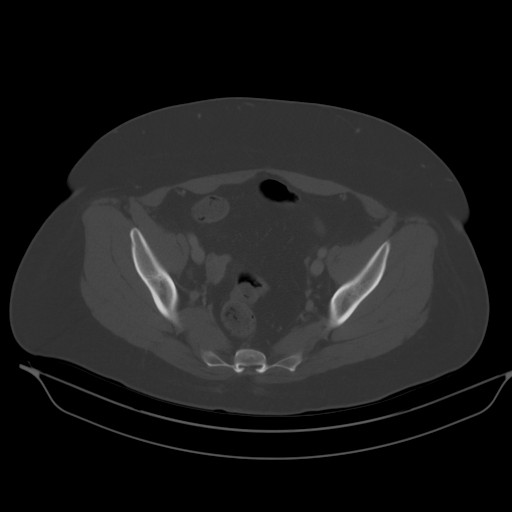
[im 36/54  soft-tissue]
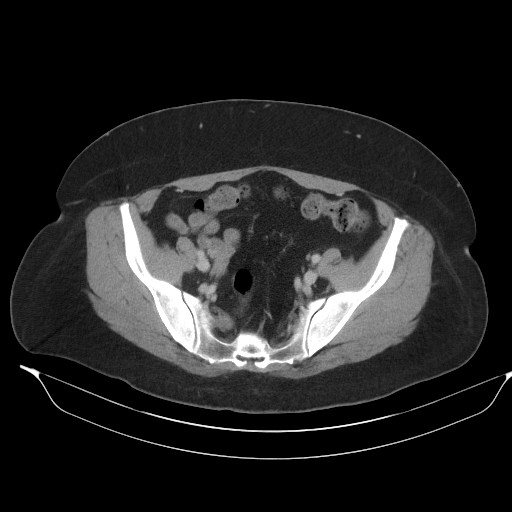
[im 40/54  soft-tissue]
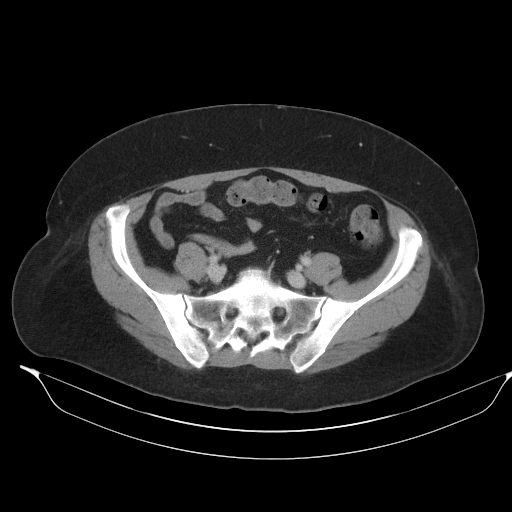
[im 43/54  soft-tissue]
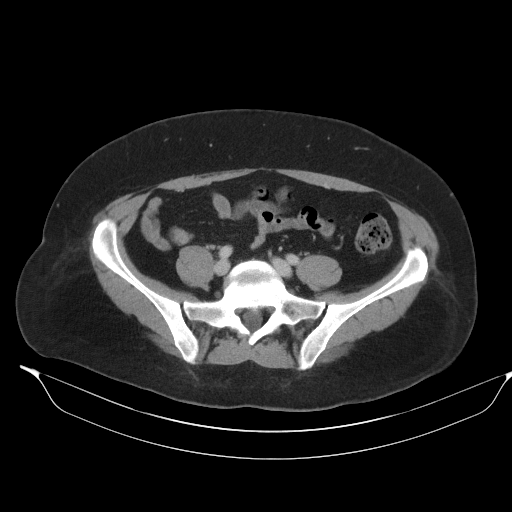
[im 47/54  soft-tissue]
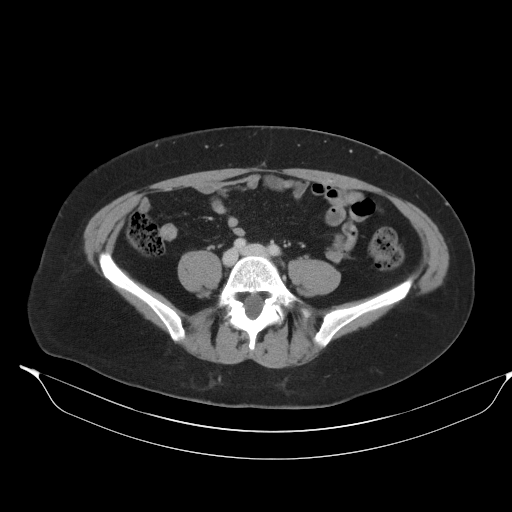
[im 47/54  lung]
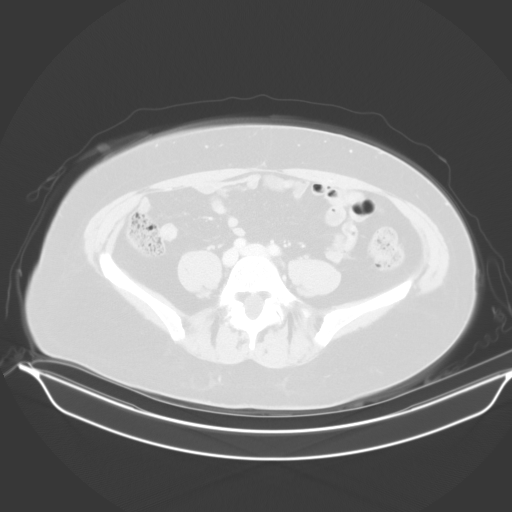
[im 48/54  lung]
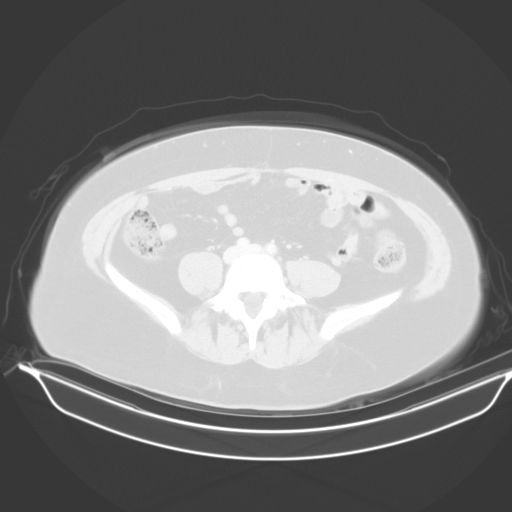
[im 50/54  soft-tissue]
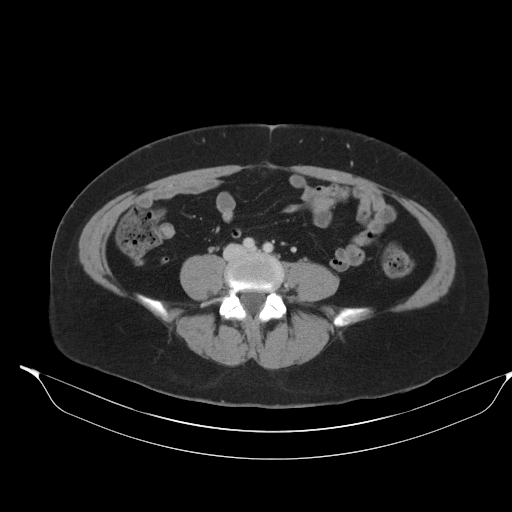
[im 50/54  lung]
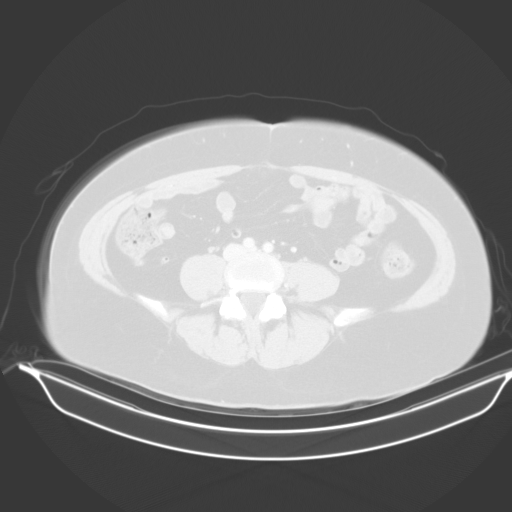
[im 52/54  lung]
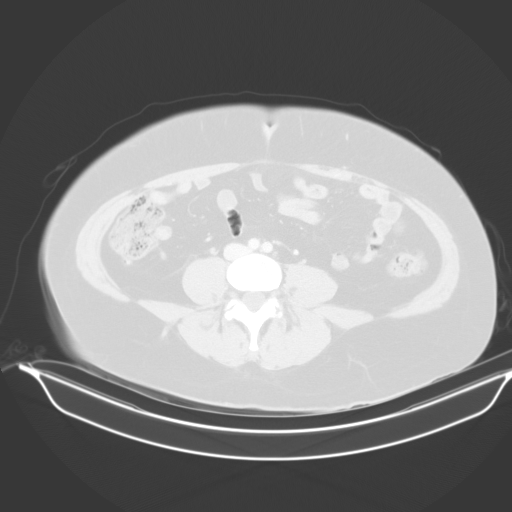

[16 of 32 positions shown; findings below may reference images not displayed]

FINDINGS: Lower Urinary Tract: Unremarkable urinary bladder.

Bowel: Unremarkable pelvic bowel loops.

Vascular/Lymphatic: No pathologically enlarged lymph nodes or other
significant abnormality.

Reproductive: IUD seen in appropriate position. No mass or other
significant abnormality.

Other: None.

Musculoskeletal: No suspicious bone lesions identified.
IMPRESSION: IUD in appropriate position. No acute findings or other significant
abnormality.

## 2022-05-05 DIAGNOSIS — H9201 Otalgia, right ear: Secondary | ICD-10-CM | POA: Insufficient documentation

## 2022-05-05 DIAGNOSIS — I10 Essential (primary) hypertension: Secondary | ICD-10-CM | POA: Insufficient documentation

## 2023-02-19 ENCOUNTER — Emergency Department (HOSPITAL_BASED_OUTPATIENT_CLINIC_OR_DEPARTMENT_OTHER): Payer: BC Managed Care – PPO

## 2023-02-19 ENCOUNTER — Other Ambulatory Visit: Payer: Self-pay

## 2023-02-19 ENCOUNTER — Encounter (HOSPITAL_BASED_OUTPATIENT_CLINIC_OR_DEPARTMENT_OTHER): Payer: Self-pay | Admitting: Emergency Medicine

## 2023-02-19 ENCOUNTER — Emergency Department (HOSPITAL_BASED_OUTPATIENT_CLINIC_OR_DEPARTMENT_OTHER)
Admission: EM | Admit: 2023-02-19 | Discharge: 2023-02-19 | Disposition: A | Discharge status: Home / Self Care | Payer: BC Managed Care – PPO | Attending: Emergency Medicine | Admitting: Emergency Medicine

## 2023-02-19 DIAGNOSIS — R0789 Other chest pain: Secondary | ICD-10-CM | POA: Insufficient documentation

## 2023-02-19 DIAGNOSIS — I10 Essential (primary) hypertension: Secondary | ICD-10-CM | POA: Diagnosis not present

## 2023-02-19 DIAGNOSIS — R079 Chest pain, unspecified: Secondary | ICD-10-CM

## 2023-02-19 LAB — CBC
HCT: 43.9 % (ref 36.0–46.0)
Hemoglobin: 14.7 g/dL (ref 12.0–15.0)
MCH: 28.7 pg (ref 26.0–34.0)
MCHC: 33.5 g/dL (ref 30.0–36.0)
MCV: 85.6 fL (ref 80.0–100.0)
Platelets: 253 10*3/uL (ref 150–400)
RBC: 5.13 MIL/uL — ABNORMAL HIGH (ref 3.87–5.11)
RDW: 13.3 % (ref 11.5–15.5)
WBC: 9.7 10*3/uL (ref 4.0–10.5)
nRBC: 0 % (ref 0.0–0.2)

## 2023-02-19 LAB — BASIC METABOLIC PANEL
Anion gap: 9 (ref 5–15)
BUN: 14 mg/dL (ref 6–20)
CO2: 24 mmol/L (ref 22–32)
Calcium: 8.8 mg/dL — ABNORMAL LOW (ref 8.9–10.3)
Chloride: 103 mmol/L (ref 98–111)
Creatinine, Ser: 0.87 mg/dL (ref 0.44–1.00)
GFR, Estimated: >60 mL/min (ref >60)
Glucose, Bld: 92 mg/dL (ref 70–99)
Potassium: 3.5 mmol/L (ref 3.5–5.1)
Sodium: 136 mmol/L (ref 135–145)

## 2023-02-19 LAB — PREGNANCY, URINE: Preg Test, Ur: NEGATIVE

## 2023-02-19 LAB — D-DIMER, QUANTITATIVE: D-Dimer, Quant: 0.62 ug/mL-FEU — ABNORMAL HIGH (ref 0.00–0.50)

## 2023-02-19 LAB — TROPONIN I (HIGH SENSITIVITY)
Troponin I (High Sensitivity): 3 ng/L (ref <18)
Troponin I (High Sensitivity): 3 ng/L (ref <18)

## 2023-02-19 MED ORDER — IOHEXOL 350 MG/ML SOLN
75.0000 mL | Freq: Once | INTRAVENOUS | Status: AC | PRN
  Administered 2023-02-19: 75 mL via INTRAVENOUS

## 2023-02-19 MED ORDER — OMEPRAZOLE 20 MG PO CPDR: 20.0000 mg | DELAYED_RELEASE_CAPSULE | Freq: Every day | ORAL | 0 refills | Status: DC

## 2023-02-19 NOTE — Discharge Instructions (Addendum)
Please take your medications as prescribed. Take tylenol/ibuprofen for pain. I recommend close follow-up with cardiology and PCP for reevaluation.  Please do not hesitate to return to emergency department if worrisome signs symptoms we discussed become apparent.  

## 2023-02-19 NOTE — ED Provider Notes (Signed)
[facility] Provider Note   CSN: [id number] Arrival date & time: 02/19/23  1310     History  Chief Complaint  Patient presents with   Chest Pain    Katherine Leon is a 52 y.o. female with a past medical history of hypertension, GERD presents today for evaluation of chest pain.  States states she has had chest pain in the last 7 days.  Pain is located on the left side of her chest, radiates to her armpit.  She denies any neck pain, back pain, extremity weakness or numbness.  Denies nausea, vomiting, shortness of breath.  Denies any recent travel or surgery, hemoptysis, leg swelling, personal or family history of DVT/PE.  States she has been trying Tylenol with no relief.  History of GERD, however states she feels differently this time.   Chest Pain     Past Medical History:  Diagnosis Date   Anxiety    Depression    GERD (gastroesophageal reflux disease)    History of kidney stones      Home Medications Prior to Admission medications   Medication Sig Start Date End Date Taking? Authorizing Provider  azithromycin (ZITHROMAX) 250 MG tablet Take 2 pills on first day then take one daily 07/16/17   Anson Fret, MD  cetirizine (ZYRTEC) 10 MG tablet Take 10 mg daily by mouth.    [provider]  fluocinonide cream (LIDEX) 0.05 % Apply 1 application topically daily as needed (eczema).    [provider]  fluticasone (FLONASE) 50 MCG/ACT nasal spray Place 2 sprays into both nostrils daily as needed for allergies or rhinitis.    [provider]  Ketotifen Fumarate (ALAWAY OP) Apply 1 drop to eye daily as needed (allergies).    [provider]  LORazepam (ATIVAN PO) Take 0.25 mg as needed by mouth.    [provider]  methylPREDNISolone (MEDROL DOSEPAK) 4 MG TBPK tablet Take pills daily together in the morning with food over 6 days 07/16/17   Anson Fret, MD  [facility] INTRAUTERINE COPPER  IUD IUD 1 each by Intrauterine route once. Inserted 2 years ago    [provider]  traZODone (DESYREL) 150 MG tablet Take 150 mg by mouth at bedtime.    [provider]      Allergies    Patient has no known allergies.    Review of Systems   Review of Systems  Cardiovascular:  Positive for chest pain.    Physical Exam Updated Vital Signs BP (!) 154/92 (BP Location: Left Arm)   Pulse 99   Temp 99.7 F (37.6 C)   Resp 20   Ht 5\' 4"  (1.626 m)   Wt 79.4 kg   SpO2 100%   BMI 30.04 kg/m  Physical Exam Vitals and nursing note reviewed.  Constitutional:      Appearance: Normal appearance.  HENT:     Head: Normocephalic and atraumatic.     Mouth/Throat:     Mouth: Mucous membranes are moist.  Eyes:     General: No scleral icterus. Cardiovascular:     Rate and Rhythm: Normal rate and regular rhythm.     Pulses: Normal pulses.     Heart sounds: Normal heart sounds.  Pulmonary:     Effort: Pulmonary effort is normal.     Breath sounds: Normal breath sounds.  Abdominal:     General: Abdomen is flat.     Palpations: Abdomen is soft.  Tenderness: There is no abdominal tenderness.  Musculoskeletal:        General: No deformity.  Skin:    General: Skin is warm.     Findings: No rash.  Neurological:     General: No focal deficit present.     Mental Status: She is alert.  Psychiatric:        Mood and Affect: Mood normal.     ED Results / Procedures / Treatments   Labs (all labs ordered are listed, but only abnormal results are displayed) Labs Reviewed - No data to display  EKG None  Radiology No results found.  Procedures Procedures    Medications Ordered in ED Medications - No data to display  ED Course/ Medical Decision Making/ A&P                             Medical Decision Making Amount and/or Complexity of Data Reviewed Labs: ordered. Radiology: ordered.  Risk Prescription drug management.   This patient presents to the  ED for chest pain, this involves an extensive number of treatment options, and is a complaint that carries with a high risk of complications and morbidity.  The differential diagnosis includes ACS, pericarditis, PE, pneumothorax, pneumonia, less likely dissection with essentially normal blood pressure, symmetric bilateral pulses, and no back pain.  This is not an exhaustive list.  Lab tests: I ordered and personally interpreted labs.  The pertinent results include: WBC unremarkable. Hbg unremarkable. Platelets unremarkable. Electrolytes unremarkable. BUN, creatinine unremarkable.  Delta troponin negative.  Imaging studies: I ordered imaging studies. I personally reviewed, interpreted imaging and agree with the radiologist's interpretations. The results include: Chest x-ray showed no acute cardiopulmonary abnormalities.  CT angio chest showed no PE.  Problem list/ ED course/ Critical interventions/ Medical management: HPI: See above Vital signs within normal range and stable throughout visit. Laboratory/imaging studies significant for: See above. On physical examination, patient is afebrile and appears in no acute distress.  Exam without evidence of volume overload so doubt heart failure.  Unlikely ACS/MI, EKG without signs of active ischemia, delta troponin was negative.  Presentation not consistent with PE, patient has elevated D-dimer but CT scan of the chest showed no evidence of PE.  Based on chest x-ray, unlikely pneumonia, pneumothorax.  Heart score of 2 so plan to discharge patient home with PCP follow-up.  Strict return precaution discussed. I have reviewed the patient home medicines and have made adjustments as needed.  Cardiac monitoring/EKG: The patient was maintained on a cardiac monitor.  I personally reviewed and interpreted the cardiac monitor which showed an underlying rhythm of: sinus rhythm.  Additional history obtained: External records from outside source obtained and reviewed  including: Chart review including previous notes, labs, imaging.  Consultations obtained:  Disposition Continued outpatient therapy. Follow-up with cardiology recommended for reevaluation of symptoms. Treatment plan discussed with patient.  Pt acknowledged understanding was agreeable to the plan. Worrisome signs and symptoms were discussed with patient, and patient acknowledged understanding to return to the ED if they noticed these signs and symptoms. Patient was stable upon discharge.   This chart was dictated using voice recognition software.  Despite best efforts to proofread,  errors can occur which can change the documentation meaning.          Final Clinical Impression(s) / ED Diagnoses Final diagnoses:  Nonspecific chest pain    Rx / DC Orders ED Discharge Orders  Ordered    Ambulatory referral to Cardiology       Comments: If you have not heard from the Cardiology office within the next 72 hours please call [phone].   02/19/23 1645    omeprazole (PRILOSEC) 20 MG capsule  Daily        02/19/23 1647              Jeanelle Malling, PA 02/19/23 1654    Alvira Monday, MD 02/20/23 2131

## 2023-02-19 NOTE — ED Notes (Signed)
Cannot discharge, registration in chart.   

## 2023-02-19 NOTE — ED Triage Notes (Signed)
Patient arrives ambulatory by POV c/o left sided chest pain, nausea and dizziness since last Tuesday. Reports pain is difficult to explain and is a sharp pain but constant. Denies any cardiac hx. Took tylenol around 11am today w/o any relief.

## 2023-04-20 DIAGNOSIS — R87619 Unspecified abnormal cytological findings in specimens from cervix uteri: Secondary | ICD-10-CM | POA: Insufficient documentation

## 2023-04-24 ENCOUNTER — Encounter: Payer: Self-pay | Admitting: Cardiology

## 2023-04-24 ENCOUNTER — Ambulatory Visit: Payer: BC Managed Care – PPO | Attending: Cardiology | Admitting: Cardiology

## 2023-04-24 VITALS — BP 116/74 | HR 73 | Ht 65.0 in | Wt 179.0 lb

## 2023-04-24 DIAGNOSIS — R0789 Other chest pain: Secondary | ICD-10-CM | POA: Insufficient documentation

## 2023-04-24 DIAGNOSIS — E785 Hyperlipidemia, unspecified: Secondary | ICD-10-CM | POA: Insufficient documentation

## 2023-04-24 DIAGNOSIS — R072 Precordial pain: Secondary | ICD-10-CM

## 2023-04-24 DIAGNOSIS — E7800 Pure hypercholesterolemia, unspecified: Secondary | ICD-10-CM | POA: Diagnosis not present

## 2023-04-24 DIAGNOSIS — I10 Essential (primary) hypertension: Secondary | ICD-10-CM

## 2023-04-24 DIAGNOSIS — R7303 Prediabetes: Secondary | ICD-10-CM

## 2023-04-24 MED ORDER — ASPIRIN 81 MG PO TBEC: 81.0000 mg | DELAYED_RELEASE_TABLET | Freq: Every day | ORAL | Status: AC

## 2023-04-24 MED ORDER — METOPROLOL TARTRATE 100 MG PO TABS: ORAL_TABLET | ORAL | 0 refills | Status: DC

## 2023-04-24 NOTE — Addendum Note (Signed)
Addended by: Baldo Ash D on: 04/24/2023 03:45 PM   Modules accepted: Orders

## 2023-04-24 NOTE — Patient Instructions (Signed)
Medication Instructions:   TAKE: Metoprolol 100mg  1 tablet 2 hours prior to CT scan  START: Enteric Coated Aspirin 81mg  1 tablet daily   Lab Work: [employer] - today 3rd Floor  [address] If you have labs (blood work) drawn today and your tests are completely normal, you will receive your results only by: [employer] (if you have MyChart) OR A paper copy in the mail If you have any lab test that is abnormal or we need to change your treatment, we will call you to review the results.   Testing/Procedures:  Your cardiac CT will be scheduled at one of the below locations:   [facility] [address] [place], [place] [address] [phone]   If scheduled at [facility], please arrive at the [facility] Children's Entrance (Entrance C2) of [facility] 30 minutes prior to test start time. You can use the FREE valet parking offered at entrance C (encouraged to control the heart rate for the test)  Proceed to the [facility] Radiology Department (first floor) to check-in and test prep.  All radiology patients and guests should use entrance C2 at [facility], accessed from [facility] even though the hospital's physical address listed is [address].      Please follow these instructions carefully (unless otherwise directed):   On the Night Before the Test: Be sure to Drink plenty of water. Do not consume any caffeinated/decaffeinated beverages or chocolate 12 hours prior to your test. Do not take any antihistamines 12 hours prior to your test.  On the Day of the Test: Drink plenty of water until 1 hour prior to the test. Do not eat any food 4 hours prior to the test. You may take your regular medications prior to the test.  Take metoprolol (Lopressor) two hours prior to test. FEMALES- please wear underwire-free bra if available, avoid dresses & tight clothing      After the Test: Drink plenty of water. After  receiving IV contrast, you may experience a mild flushed feeling. This is normal. On occasion, you may experience a mild rash up to 24 hours after the test. This is not dangerous. If this occurs, you can take Benadryl 25 mg and increase your fluid intake. If you experience trouble breathing, this can be serious. If it is severe call 911 IMMEDIATELY. If it is mild, please call our office. If you take any of these medications: Glipizide/Metformin, Avandament, Glucavance, please do not take 48 hours after completing test unless otherwise instructed.  We will call to schedule your test 2-4 weeks out understanding that some insurance companies will need an authorization prior to the service being performed.   For non-scheduling related questions, please contact the cardiac imaging nurse navigator should you have any questions/concerns: Rockwell Alexandria, Cardiac Imaging Nurse Navigator Larey Brick, Cardiac Imaging Nurse Navigator [facility] Direct Office Dial: [phone]   For scheduling needs, including cancellations and rescheduling, please call [place], [phone].    Follow-Up: At [facility], you and your health needs are our priority.  As part of our continuing mission to provide you with exceptional heart care, we have created designated Provider Care Teams.  These Care Teams include your primary Cardiologist (physician) and Advanced Practice Providers (APPs -  Physician Assistants and Nurse Practitioners) who all work together to provide you with the care you need, when you need it.  We recommend signing up for the patient portal called "  MyChart".  Sign up information is provided on this After Visit Summary.  MyChart is used to connect with patients for Virtual Visits (Telemedicine).  Patients are able to view lab/test results, encounter notes, upcoming appointments, etc.  Non-urgent messages can be sent to your provider as well.   To learn more about what you  can do with MyChart, go to [email or web address].    Your next appointment:   2 month(s)  The format for your next appointment:   In Person  Provider:   Gypsy Balsam, MD    Other Instructions NA

## 2023-04-24 NOTE — Progress Notes (Signed)
Cardiology Consultation:    Date:  04/24/2023   ID:  Katherine Leon, DOB 1971-04-19, MRN [id number]  PCP:  Bailey Mech, PA-C  Cardiologist:  Gypsy Balsam, MD   Referring MD: Bernerd Pho*   Chief Complaint  Patient presents with   Chest Pain    Seen at [facility]     History of Present Illness:    Katherine Leon is a 52 y.o. female who is being seen today for the evaluation of chest pain at the request of Bernerd Pho*.  Past medical history significant for essential hypertension, dyslipidemia, prediabetes.  She was referred to [place] from the emergency room after she developed chest pain.  Pain was somewhat atypical left shoulder kind of pressure on and off lasting for few hours, no sweating no shortness of breath no provoking no relieving factors, she grade the sensation lasts for an scale up to 10.  Packing, EXTR were normal she was discharged home she was advised to follow-up with [place] since the time of the chest pain she does have no more chest pain.  She does have multiple risk factors for coronary disease namely prediabetes dyslipidemia hypertension likely she never smoked she does not have family history of coronary artery disease.  She does not exercise on the regular basis.  She is not on any special diet but she is aware of the fact that she need to do something to prevent her diabetes from developing  Past Medical History:  Diagnosis Date   Anxiety    Depression    GERD (gastroesophageal reflux disease)    History of kidney stones     Past Surgical History:  Procedure Laterality Date   CESAREAN SECTION     x 1   CHOLECYSTECTOMY     ESOPHAGOGASTRODUODENOSCOPY (EGD) WITH PROPOFOL N/A 12/04/2016   Procedure: ESOPHAGOGASTRODUODENOSCOPY (EGD) WITH PROPOFOL;  Surgeon: Charolett Bumpers, MD;  Location: [facility] ENDOSCOPY;  Service: Endoscopy;  Laterality: N/A;   EXTRACORPOREAL SHOCK WAVE LITHOTRIPSY     x 1    Current  Medications: Current Meds  Medication Sig   busPIRone (BUSPAR) 15 MG tablet Take 15 mg by mouth 2 (two) times daily.   cyanocobalamin (VITAMIN B12) 1000 MCG tablet Take 1,000 mcg by mouth daily.   estradiol (CLIMARA - DOSED IN MG/24 HR) 0.05 mg/24hr patch Place 0.05 mg onto the skin once a week.   fluocinonide cream (LIDEX) 0.05 % Apply 1 application topically daily as needed (eczema).   fluticasone (FLONASE) 50 MCG/ACT nasal spray Place 2 sprays into both nostrils daily as needed for allergies or rhinitis.   lisinopril (ZESTRIL) 5 MG tablet Take 5 mg by mouth daily.   Omega-3 1000 MG CAPS Take 1 capsule by mouth daily.   OVER THE COUNTER MEDICATION Apply 1 Application topically as needed (rash).   OVER THE COUNTER MEDICATION Take 1 tablet by mouth 4 (four) times daily. Nutrafol Balance vitamins for hair loss   pantoprazole (PROTONIX) 40 MG tablet Take 40 mg by mouth daily.   PARAGARD INTRAUTERINE COPPER IUD IUD 1 each by Intrauterine route once. Inserted 2 years ago   phentermine 15 MG capsule Take 15 mg by mouth daily.   Probiotic Product (PROBIOTIC DAILY PO) Take 200 mg by mouth daily. Culture Inulin Probiotic   progesterone (PROMETRIUM) 200 MG capsule Take 200 mg by mouth daily.   traZODone (DESYREL) 100 MG tablet Take 100 mg by mouth at bedtime.   [DISCONTINUED] azithromycin (ZITHROMAX)  250 MG tablet Take 2 pills on first day then take one daily (Patient taking differently: Take 250 mg by mouth daily. Take 2 pills on first day then take one daily)   [DISCONTINUED] cetirizine (ZYRTEC) 10 MG tablet Take 10 mg daily by mouth.   [DISCONTINUED] Ketotifen Fumarate (ALAWAY OP) Apply 1 drop to eye daily as needed (allergies).   [DISCONTINUED] LORazepam (ATIVAN PO) Take 0.25 mg by mouth as needed (anxiety).   [DISCONTINUED] methylPREDNISolone (MEDROL DOSEPAK) 4 MG TBPK tablet Take pills daily together in the morning with food over 6 days (Patient taking differently: Take 4 mg by mouth See admin  instructions. Take pills daily together in the morning with food over 6 days)   [DISCONTINUED] omeprazole (PRILOSEC) 20 MG capsule Take 1 capsule (20 mg total) by mouth daily.     Allergies:   Patient has no known allergies.   Social History   Socioeconomic History   Marital status: Divorced    Spouse name: Not on file   Number of children: Not on file   Years of education: Not on file   Highest education level: Not on file  Occupational History   Not on file  Tobacco Use   Smoking status: Never   Smokeless tobacco: Never  Substance and Sexual Activity   Alcohol use: No   Drug use: No   Sexual activity: Yes  Other Topics Concern   Not on file  Social History Narrative   Not on file   Social Determinants of Health   Financial Resource Strain: Low Risk  (11/27/2021)   [facility] prior to [facility]11/[facility]   Overall Financial Resource Strain (CARDIA)    Difficulty of Paying Living Expenses: Not hard at all  Food Insecurity: Low Risk  (09/26/2022)   [facility] prior to 11/11/2022., [facility] visits prior to 11/11/2022.   Food    Within the past 12 months, you worried that your food would run out before you got money to buy more food: Never true    Within the past 12 months, the food you bought just didn't last and you didn't have money to get more: Never true  Transportation Needs: No Transportation Needs (09/26/2022)   Received from [facility] visits prior to 11/11/2022., [facility] visits prior to 11/11/2022.   Transportation    In the past 12 months, has lack of reliable transportation kept you from medical appointments, meetings, work or from getting things needed for daily living?: No  Physical Activity: Unknown (11/27/2021)   [facility] to 11/11/2022[facility] visits prior to 11/11/2022[facility]   Exercise Vital Sign    Days of Exercise per Week: 0 days    Minutes of Exercise per Session: Not on file  Stress: Stress Concern Present (11/27/2021)   [facility] to 11[facility]11[facility]2022[facility] visits prior to 11/11/2022., [facility]   [employer] of Occupational Health - Occupational Stress Questionnaire    Feeling of Stress : To some extent  Social Connections: Socially Isolated (11/27/2021)   Received [facility] visits prior to 11[facility]11/2022[facility] visits prior to 11/11/2022[facility]  [facility]   Social Connection and Isolation Panel [NHANES]    Frequency of Communication with Friends and Family: More than three times a week    Frequency of Social Gatherings with Friends and Family: Twice a week    Attends Religious Services: Never    Database administrator or Organizations: No    Attends Engineer, structural: Never    Marital Status: Divorced     Family History: The patient's family history includes Breast cancer in her maternal grandmother; Migraines in her brother. ROS:   Please see the history of present illness.    All 14 point review of systems negative except as described per history of present illness.  EKGs/Labs/Other Studies Reviewed:    The following studies were reviewed today:   EKG:  EKG Interpretation Date/Time:  Tuesday April 24 2023 15:08:21 EDT Ventricular Rate:  76 PR Interval:  110 QRS Duration:  74 QT Interval:  372 QTC Calculation: 418 R Axis:   42  Text Interpretation: Sinus rhythm with short PR When compared with ECG of 19-Feb-2023 13:16, PREVIOUS ECG IS PRESENT Confirmed by Gypsy Balsam [phone]) on 04/24/2023 3:18:01 PM    Recent Labs: 02/19/2023:  BUN 14; Creatinine, Ser 0.87; Hemoglobin 14.7; Platelets 253; Potassium 3.5; Sodium 136  Recent Lipid Panel No results found for: "CHOL", "TRIG", "HDL", "CHOLHDL", "VLDL", "LDLCALC", "LDLDIRECT"  Physical Exam:    VS:  BP 116/74 (BP Location: Left Arm, Patient Position: Sitting)   Pulse 73   Ht 5\' 5"  (1.651 m)   Wt 179 lb (81.2 kg)   SpO2 97%   BMI 29.79 kg/m     Wt Readings from Last 3 Encounters:  04/24/23 179 lb (81.2 kg)  02/19/23 175 lb (79.4 kg)  07/16/17 194 lb (88 kg)     GEN:  Well nourished, well developed in no acute distress HEENT: Normal NECK: No JVD; No carotid bruits LYMPHATICS: No lymphadenopathy CARDIAC: RRR, no murmurs, no rubs, no gallops RESPIRATORY:  Clear to auscultation without rales, wheezing or rhonchi  ABDOMEN: Soft, non-tender, non-distended MUSCULOSKELETAL:  No edema; No deformity  SKIN: Warm and dry NEUROLOGIC:  Alert and oriented x 3 PSYCHIATRIC:  Normal affect   ASSESSMENT:    1. Essential hypertension   2. Atypical chest pain   3. Pure hypercholesterolemia   4. Dyslipidemia   5. Prediabetes    PLAN:    In order of problems listed above:  Atypical chest pain this lady with multiple risk factors for coronary artery disease.  Will discuss option and the situation I think the best test for her will be to do coronary CT angio which allowed [place] to determine if she had any coronary artery disease and also stratify her risk factors for prevention of future events.  In the meantime ask her to start taking 1 baby aspirin every single day. Dyslipidemia will wait for results of coronary CT angio to determine if we need to start therapy for it. Prediabetes she knows very well she is to start exercising lose weight that should help with prevention of developing diabetes.  However asked her not to push herself hard until we get coronary CT angio done.   Medication Adjustments/Labs and Tests Ordered: Current medicines are reviewed at length with the  patient today.  Concerns regarding medicines are outlined above.  Orders Placed This Encounter  Procedures   EKG 12-Lead   No orders of the defined types were placed in this encounter.   Signed, Marveen Reeks.  Bing Matter, MD, [facility]. 04/24/2023 3:34 PM    [facility]

## 2023-04-26 ENCOUNTER — Encounter (HOSPITAL_COMMUNITY): Payer: Self-pay

## 2023-04-27 ENCOUNTER — Telehealth: Payer: Self-pay

## 2023-04-27 DIAGNOSIS — E875 Hyperkalemia: Secondary | ICD-10-CM

## 2023-04-27 MED ORDER — HYDROCHLOROTHIAZIDE 12.5 MG PO CAPS: 12.5000 mg | ORAL_CAPSULE | Freq: Every day | ORAL | 1 refills | Status: DC

## 2023-04-27 MED ORDER — LISINOPRIL 2.5 MG PO TABS: 2.5000 mg | ORAL_TABLET | Freq: Every day | ORAL | 3 refills | Status: AC

## 2023-04-27 NOTE — Telephone Encounter (Signed)
-----   Message from Gypsy Balsam sent at 04/25/2023 10:19 AM EDT ----- Potassium elevated 5.7 please ask her to cut down lisinopril to only 2.5 mg daily lets give him hydrochlorothiazide 12.5 daily, she can have Chem-7 done within a week

## 2023-04-27 NOTE — Telephone Encounter (Signed)
Patient notified of results and recommendations and agreed with plan, medication list reconciled and sent, lab order on file.

## 2023-04-30 ENCOUNTER — Encounter: Payer: Self-pay | Admitting: Cardiology

## 2023-05-01 ENCOUNTER — Ambulatory Visit (HOSPITAL_COMMUNITY)
Admission: RE | Admit: 2023-05-01 | Discharge: 2023-05-01 | Disposition: A | Discharge status: Home / Self Care | Payer: BC Managed Care – PPO | Source: Ambulatory Visit | Attending: Cardiology | Admitting: Cardiology

## 2023-05-01 DIAGNOSIS — R072 Precordial pain: Secondary | ICD-10-CM | POA: Diagnosis present

## 2023-05-01 MED ORDER — NITROGLYCERIN 0.4 MG SL SUBL
SUBLINGUAL_TABLET | SUBLINGUAL | Status: AC
  Filled 2023-05-01: qty 2

## 2023-05-01 MED ORDER — NITROGLYCERIN 0.4 MG SL SUBL
0.8000 mg | SUBLINGUAL_TABLET | Freq: Once | SUBLINGUAL | Status: AC
  Administered 2023-05-01: 0.8 mg via SUBLINGUAL

## 2023-05-01 MED ORDER — IOHEXOL 350 MG/ML SOLN
100.0000 mL | Freq: Once | INTRAVENOUS | Status: AC | PRN
  Administered 2023-05-01: 100 mL via INTRAVENOUS

## 2023-05-04 ENCOUNTER — Encounter: Payer: Self-pay | Admitting: Cardiology

## 2023-05-04 ENCOUNTER — Telehealth: Payer: Self-pay

## 2023-05-04 NOTE — Telephone Encounter (Signed)
Results reviewed with pt as per Dr. Krasowski's note.  Pt verbalized understanding and had no additional questions. Routed to PCP  

## 2023-05-18 LAB — BASIC METABOLIC PANEL
BUN/Creatinine Ratio: 17 (ref 9–23)
BUN: 16 mg/dL (ref 6–24)
CO2: 22 mmol/L (ref 20–29)
Calcium: 9.6 mg/dL (ref 8.7–10.2)
Chloride: 100 mmol/L (ref 96–106)
Creatinine, Ser: 0.95 mg/dL (ref 0.57–1.00)
Glucose: 109 mg/dL — ABNORMAL HIGH (ref 70–99)
Potassium: 4.9 mmol/L (ref 3.5–5.2)
Sodium: 138 mmol/L (ref 134–144)
eGFR: 73 mL/min/{1.73_m2} (ref >59)

## 2023-05-20 ENCOUNTER — Other Ambulatory Visit: Payer: Self-pay | Admitting: Cardiology

## 2023-07-17 ENCOUNTER — Encounter: Payer: Self-pay | Admitting: Cardiology

## 2023-07-17 ENCOUNTER — Ambulatory Visit: Payer: BC Managed Care – PPO | Attending: Cardiology | Admitting: Cardiology

## 2023-07-17 VITALS — BP 128/88 | HR 79 | Ht 65.0 in | Wt 186.0 lb

## 2023-07-17 DIAGNOSIS — I2510 Atherosclerotic heart disease of native coronary artery without angina pectoris: Secondary | ICD-10-CM | POA: Diagnosis not present

## 2023-07-17 DIAGNOSIS — E785 Hyperlipidemia, unspecified: Secondary | ICD-10-CM

## 2023-07-17 DIAGNOSIS — I10 Essential (primary) hypertension: Secondary | ICD-10-CM | POA: Diagnosis not present

## 2023-07-17 NOTE — Patient Instructions (Addendum)
Medication Instructions:  Your physician recommends that you continue on your current medications as directed. Please refer to the Current Medication list given to you today.  *If you need a refill on your cardiac medications before your next appointment, please call your pharmacy*   Lab Work: 3rd Floor   [address]  Your physician recommends that you return for lab work in: 5 months- 1 week prior to follow up with Dr. Bing Matter You need to have labs done when you are fasting.  You can come Monday through Friday 8:00 am to 11:30AM and 1:00 to 4:00. You do not need to make an appointment as the order has already been placed. The labs you are going to have done are  Lipid panel   Testing/Procedures: None Ordered   Follow-Up: At [facility], you and your health needs are our priority.  As part of our continuing mission to provide you with exceptional heart care, we have created designated Provider Care Teams.  These Care Teams include your primary Cardiologist (physician) and Advanced Practice Providers (APPs -  Physician Assistants and Nurse Practitioners) who all work together to provide you with the care you need, when you need it.  We recommend signing up for the patient portal called "MyChart".  Sign up information is provided on this After Visit Summary.  MyChart is used to connect with patients for Virtual Visits (Telemedicine).  Patients are able to view lab/test results, encounter notes, upcoming appointments, etc.  Non-urgent messages can be sent to your provider as well.   To learn more about what you can do with MyChart, go to [email or web address].    Your next appointment:   5 month(s)  The format for your next appointment:   In Person  Provider:   Gypsy Balsam, MD    Other Instructions NA

## 2023-07-17 NOTE — Progress Notes (Signed)
Cardiology Office Note:    Date:  07/17/2023   ID:  Katherine Leon, DOB March 17, 1971, MRN [id number]  PCP:  Bailey Mech, PA-C  Cardiologist:  Gypsy Balsam, MD    Referring MD: Bernerd Pho*   Chief Complaint  Patient presents with   Follow-up    History of Present Illness:    Katherine Leon is a 52 y.o. female with past medical history significant for dyslipidemia, GERD she was referred to [place] because of atypical chest pain.  Evaluation included coronary CT angio which showed mild disease with a total calcium score of 32 which is 95% low, total plaque volume was 30 mm which is considered low blood volume.  She comes here today to talk about it.  Overall she is doing well denies have any chest pain tightness squeezing pressure burning chest no palpitation dizziness swelling of lower extremities.  Past Medical History:  Diagnosis Date   Anxiety    Depression    GERD (gastroesophageal reflux disease)    History of kidney stones     Past Surgical History:  Procedure Laterality Date   CESAREAN SECTION     x 1   CHOLECYSTECTOMY     ESOPHAGOGASTRODUODENOSCOPY (EGD) WITH PROPOFOL N/A 12/04/2016   Procedure: ESOPHAGOGASTRODUODENOSCOPY (EGD) WITH PROPOFOL;  Surgeon: Charolett Bumpers, MD;  Location: [facility];  Service: Endoscopy;  Laterality: N/A;   EXTRACORPOREAL SHOCK WAVE LITHOTRIPSY     x 1    Current Medications: Current Meds  Medication Sig   aspirin EC 81 MG tablet Take 1 tablet (81 mg total) by mouth daily. Swallow whole.   busPIRone (BUSPAR) 7.5 MG tablet Take 15 mg by mouth 2 (two) times daily.   cyanocobalamin (VITAMIN B12) 1000 MCG tablet Take 1,000 mcg by mouth daily.   estradiol (CLIMARA - DOSED IN MG/24 HR) 0.05 mg/24hr patch Place 0.05 mg onto the skin once a week.   fluocinonide cream (LIDEX) 0.05 % Apply 1 application topically daily as needed (eczema).   FLUoxetine (PROZAC) 20 MG capsule Take 20 mg by mouth daily.   fluticasone  (FLONASE) 50 MCG/ACT nasal spray Place 2 sprays into both nostrils daily as needed for allergies or rhinitis.   lisinopril (ZESTRIL) 2.5 MG tablet Take 1 tablet (2.5 mg total) by mouth daily.   Magnesium Oxide 250 MG TABS Take 1 tablet by mouth at bedtime.   Omega-3 1000 MG CAPS Take 1 capsule by mouth daily.   OVER THE COUNTER MEDICATION Apply 1 Application topically as needed (rash).   OVER THE COUNTER MEDICATION Take 1 tablet by mouth 4 (four) times daily. Nutrafol Balance vitamins for hair loss   pantoprazole (PROTONIX) 40 MG tablet Take 40 mg by mouth daily.   PARAGARD INTRAUTERINE COPPER IUD IUD 1 each by Intrauterine route once. Inserted 2 years ago   phentermine 15 MG capsule Take 15 mg by mouth daily.   Probiotic Product (PROBIOTIC DAILY PO) Take 200 mg by mouth daily. Culture Inulin Probiotic   progesterone (PROMETRIUM) 200 MG capsule Take 200 mg by mouth daily.   traZODone (DESYREL) 100 MG tablet Take 75 mg by mouth at bedtime.   [DISCONTINUED] hydrochlorothiazide (MICROZIDE) 12.5 MG capsule Take 1 capsule (12.5 mg total) by mouth daily.   [DISCONTINUED] metoprolol tartrate (LOPRESSOR) 100 MG tablet Take one tablet 2 hours before cardiac CT for heart greater than 55     Allergies:   Patient has no known allergies.   Social History   Socioeconomic History  Marital status: Divorced    Spouse name: Not on file   Number of children: Not on file   Years of education: Not on file   Highest education level: Not on file  Occupational History   Not on file  Tobacco Use   Smoking status: Never   Smokeless tobacco: Never  Substance and Sexual Activity   Alcohol use: No   Drug use: No   Sexual activity: Yes  Other Topics Concern   Not on file  Social History Narrative   Not on file   Social Determinants of Health   Financial Resource Strain: Low Risk  (11/27/2021)   [facility] prior to 11/11/2022[facility]   Overall Financial Resource Strain (CARDIA)    Difficulty of Paying Living Expenses: Not hard at all  Food Insecurity: Low Risk  (09/26/2022)   [facility] prior to 11/11/2022., [facility] visits prior to 11/11/2022.   Food    Within the past 12 months, you worried that your food would run out before you got money to buy more food: Never true    Within the past 12 months, the food you bought just didn't last and you didn't have money to get more: Never true  Transportation Needs: No Transportation Needs (09/26/2022)   [facility] visits prior to 11/11/2022., [facility] visits prior to 11/11/2022.   Transportation    In the past 12 months, has lack of reliable transportation kept you from medical appointments, meetings, work or from getting things needed for daily living?: No  Physical Activity: Unknown (11/27/2021)   [facility] prior to 11/11/2022[facility] visits prior to 11/11/2022., [facility]   Exercise Vital Sign    Days of Exercise per Week: 0 days    Minutes of Exercise per Session: Not on file  Stress: Stress Concern Present (11/27/2021)   [facility] to 11/11/2022[facility] visits prior to 11/11/2022., [facility]   [employer] of Occupational Health - Occupational Stress Questionnaire    Feeling of Stress : To some extent  Social Connections: Socially Isolated (11/27/2021)   [facility] to 11/11/2022[facility], [facility] visits prior to 11/11/2022., [facility]   Social Connection and Isolation Panel [NHANES]    Frequency of Communication with Friends and Family:  More than three times a week    Frequency of Social Gatherings with Friends and Family: Twice a week    Attends Religious Services: Never    Database administrator or Organizations: No    Attends Engineer, structural: Never    Marital Status: Divorced     Family History: The patient's family history includes Breast cancer in her maternal grandmother; Migraines in her brother. ROS:   Please see the history of present illness.    All 14 point review of systems negative except as described per history of present illness  EKGs/Labs/Other Studies Reviewed:         Recent Labs: 02/19/2023: Hemoglobin 14.7; Platelets 253 05/17/2023: BUN 16; Creatinine, Ser 0.95; Potassium 4.9; Sodium 138  Recent Lipid Panel No results found for: "CHOL", "TRIG", "  HDL", "CHOLHDL", "VLDL", "LDLCALC", "LDLDIRECT"  Physical Exam:    VS:  BP 128/88 (BP Location: Left Arm, Patient Position: Sitting)   Pulse 79   Ht 5\' 5"  (1.651 m)   Wt 186 lb (84.4 kg)   SpO2 98%   BMI 30.95 kg/m     Wt Readings from Last 3 Encounters:  07/17/23 186 lb (84.4 kg)  04/24/23 179 lb (81.2 kg)  02/19/23 175 lb (79.4 kg)     GEN:  Well nourished, well developed in no acute distress HEENT: Normal NECK: No JVD; No carotid bruits LYMPHATICS: No lymphadenopathy CARDIAC: RRR, no murmurs, no rubs, no gallops RESPIRATORY:  Clear to auscultation without rales, wheezing or rhonchi  ABDOMEN: Soft, non-tender, non-distended MUSCULOSKELETAL:  No edema; No deformity  SKIN: Warm and dry LOWER EXTREMITIES: no swelling NEUROLOGIC:  Alert and oriented x 3 PSYCHIATRIC:  Normal affect   ASSESSMENT:    1. Coronary artery disease involving native coronary artery of native heart without angina pectoris   2. Essential hypertension   3. Dyslipidemia    PLAN:    In order of problems listed above:  Coronary disease nonobstructive.  I had a long discussion with her about the fact that she need to take care of herself.  We  discussed basic of Mediterranean diet as well as need to exercise at least 5 times a week for 30 minutes.  She is already on antiplatelet therapy which I will continue. Dyslipidemia I did review K PN which show me LDL 150 HDL 53.  I wanted to put her on cholesterol medication she does not want to do that.  She said she preferred to exercise and stick with good diet.  Will check her fasting lipid profile about 5 months and then decide. Essential hypertension blood pressure well-controlled continue present management   Medication Adjustments/Labs and Tests Ordered: Current medicines are reviewed at length with the patient today.  Concerns regarding medicines are outlined above.  No orders of the defined types were placed in this encounter.  Medication changes: No orders of the defined types were placed in this encounter.   Signed, Georgeanna Lea, MD, [facility] 07/17/2023 3:52 PM    [facility]

## 2024-01-11 ENCOUNTER — Ambulatory Visit: Payer: Self-pay | Admitting: Podiatry

## 2024-01-11 ENCOUNTER — Encounter: Payer: Self-pay | Admitting: Podiatry

## 2024-01-11 DIAGNOSIS — M79672 Pain in left foot: Secondary | ICD-10-CM

## 2024-01-11 DIAGNOSIS — M7989 Other specified soft tissue disorders: Secondary | ICD-10-CM

## 2024-01-11 NOTE — Progress Notes (Unsigned)
  Subjective:  Patient ID: Katherine Leon, female    DOB: 25-Aug-1971,  MRN: [id number]  Chief Complaint  Patient presents with   Foot Swelling    RM#11 Left foot swelling no current injuries foot started swelling back in 06/2023.Went to primary care in 10/2023 negative x-ray patient states here to have podiatrist evaluate. Minimal swelling today no pain.    Discussed the use of AI scribe software for clinical note transcription with the patient, who gave verbal consent to proceed.  History of Present Illness Katherine Leon is a 53 year old female who presents with left foot pain and swelling.  She experiences achy, burning pain and swelling in her left foot. The ache is consistent, while the swelling is intermittent. Tenderness is present on the top of the left foot, with no pain on the bottom or in the ball of the foot. Occasional cramping in her toes is attributed to the swelling. She has tried elevating her foot and using Tylenol for relief, but the effectiveness is uncertain. Over-the-counter compression socks were uncomfortable and did not provide relief. No calf pain, groin pain, or hip pain.  X-rays were performed previously have shown a small spur in the heel. 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 She is not diabetic but is managing her pre-diabetic status.      Objective:    Physical Exam General: AAO x3, NAD  Dermatological: Skin is warm, dry and supple bilateral. There are no open sores, no preulcerative lesions, no rash or signs of infection present.  Vascular: Dorsalis Pedis artery and Posterior Tibial artery pedal pulses are 2/4 bilateral with immedate capillary fill time.  No varicosities and no lower extremity edema present bilateral. There is no pain with calf compression, swelling, warmth, erythema.   Neruologic: Grossly intact via light touch bilateral.   Musculoskeletal: There is mild diffuse  tenderness on the dorsal forefoot.  There is edema to the foot, leg.  The left calf circumference is 41 cm on the right is 40 cm.  There is no erythema or warmth.  The calf is supple.  Intact.  Gait: Unassisted, Nonantalgic.         Results RADIOLOGY Foot X-ray: Calcaneal spur   Assessment:   1. Left leg swelling   2. Left foot pain      Plan:  Patient was evaluated and treated and all questions answered.  Assessment and Plan Assessment & Plan Unilateral left leg swelling -Previously had x-rays which showed bone spur in the heel.  She has no pain to this area. - Order venous reflux study for left leg. - If normal, order MRI of the foot. - Advise leg elevation, reduced salt intake, avoid prolonged sitting/standing. - Encourage walking for circulation. - Discuss compression socks post-study results. - Monitor for medication changes affecting swelling.     Return for ultrasound results.   Charity Conch DPM

## 2024-01-14 ENCOUNTER — Ambulatory Visit (HOSPITAL_COMMUNITY)
Admission: RE | Admit: 2024-01-14 | Discharge: 2024-01-14 | Disposition: A | Discharge status: Home / Self Care | Source: Ambulatory Visit | Attending: Podiatry | Admitting: Podiatry

## 2024-01-14 DIAGNOSIS — M7989 Other specified soft tissue disorders: Secondary | ICD-10-CM | POA: Insufficient documentation

## 2024-01-17 ENCOUNTER — Other Ambulatory Visit: Payer: Self-pay | Admitting: Podiatry

## 2024-01-17 ENCOUNTER — Encounter: Payer: Self-pay | Admitting: Podiatry

## 2024-01-17 DIAGNOSIS — I872 Venous insufficiency (chronic) (peripheral): Secondary | ICD-10-CM

## 2024-03-05 ENCOUNTER — Encounter: Payer: Self-pay | Admitting: Physician Assistant

## 2024-03-05 ENCOUNTER — Ambulatory Visit: Attending: Vascular Surgery | Admitting: Physician Assistant

## 2024-03-05 VITALS — BP 122/78 | HR 80 | Temp 98.3°F | Ht 65.0 in | Wt 193.6 lb

## 2024-03-05 DIAGNOSIS — I872 Venous insufficiency (chronic) (peripheral): Secondary | ICD-10-CM | POA: Diagnosis not present

## 2024-03-05 DIAGNOSIS — M7989 Other specified soft tissue disorders: Secondary | ICD-10-CM

## 2024-03-05 NOTE — Progress Notes (Signed)
 Requested by:  Gershon Donnice SAUNDERS, DPM [address] [place],  [place] [address]  Reason for consultation: LLE swelling    History of Present Illness   Katherine Leon is a 53 y.o. (March 02, 1971) female who presents for evaluation of LLE swelling. She explains that she started to notice the swelling mostly in her left foot in October. She denies any injury to her leg. She saw both a Podiatrist as well as her PCP regarding the swelling. She had some x-rays that were negative, as well as ultrasound that showed venous reflux. She does explain that in April of last year her job became more sedentary so she sits most of the day at a desk. She admittedly is not very active outside of work. She says the swelling in her left leg progresses at the day goes on. She also has associated aching when the swelling is worse. She says the swelling is not every day though. She has been wearing 15-20 mmHg knee high compression stockings. She does feel that these help especially with the aching. She was elevating but did not feel that it was very helpful. She denies any throbbing, burning, itching, bleeding, ulceration, varicosities, discoloration or skin changes. No history of DVT.   Venous symptoms include: aching, swelling Onset/duration:  > 6 months  Occupation:  desk job Aggravating factors: prolonged sitting Alleviating factors: compression Compression:  yes Helps:  yes Pain medications:  no Previous vein procedures:  no History of DVT:  no  Past Medical History:  Diagnosis Date   Anxiety    Depression    GERD (gastroesophageal reflux disease)    History of kidney stones     Past Surgical History:  Procedure Laterality Date   CESAREAN SECTION     x 1   CHOLECYSTECTOMY     ESOPHAGOGASTRODUODENOSCOPY (EGD) WITH PROPOFOL  N/A 12/04/2016   Procedure: ESOPHAGOGASTRODUODENOSCOPY (EGD) WITH PROPOFOL ;  Surgeon: Gladis MARLA Louder, MD;  Location: [facility];  Service: Endoscopy;  Laterality:  N/A;   EXTRACORPOREAL SHOCK WAVE LITHOTRIPSY     x 1    Social History   Socioeconomic History   Marital status: Divorced    Spouse name: Not on file   Number of children: Not on file   Years of education: Not on file   Highest education level: Not on file  Occupational History   Not on file  Tobacco Use   Smoking status: Never   Smokeless tobacco: Never  Substance and Sexual Activity   Alcohol use: No   Drug use: No   Sexual activity: Yes  Other Topics Concern   Not on file  Social History Narrative   Not on file   Social Drivers of Health   Financial Resource Strain: Low Risk  (11/27/2021)   [facility] to 11/11/2022[facility]   Overall Financial Resource Strain (CARDIA)    Difficulty of Paying Living Expenses: Not hard at all  Food Insecurity: Low Risk  (09/26/2022)   [facility] visits prior to 11/11/2022.   Food    Within the past 12 months, you worried that your food would run out before you got money to buy more food: Never true    Within the past 12 months, the food you bought just didn't last and you didn't have money to get more: Never true  Transportation Needs: No Transportation Needs (09/26/2022)   [facility] visits prior to 11/11/2022.   Transportation    In the past 12 months, has lack of reliable transportation kept you from medical appointments, meetings, work or from getting things needed for daily living?: No  Physical Activity: Unknown (11/27/2021)   [facility] prior to 11/11/2022., [facility]   Exercise Vital Sign    On average, how many days per week do you engage in moderate to strenuous exercise (like a brisk walk)?: 0 days    Minutes of Exercise per Session: Not on file  Stress: Stress Concern Present (11/27/2021)   [facility] visits prior to  11/11/2022., [facility]   [employer] of Occupational Health - Occupational Stress Questionnaire    Feeling of Stress : To some extent  Social Connections: Socially Isolated (11/27/2021)   [facility] visits prior to 11/11/2022., [facility]   Social Connection and Isolation Panel    In a typical week, how many times do you talk on the phone with family, friends, or neighbors?: More than three times a week    How often do you get together with friends or relatives?: Twice a week    How often do you attend church or religious services?: Never    Do you belong to any clubs or organizations such as church groups, unions, fraternal or athletic groups, or school groups?: No    How often do you attend meetings of the clubs or organizations you belong to?: Never    Are you married, widowed, divorced, separated, never married, or living with a partner?: Divorced  Intimate Partner Violence: Not At Risk (11/27/2021)   [facility] prior to 11/11/2022.   Humiliation, Afraid, Rape, and Kick questionnaire    Within the last year, have you been afraid of your partner or ex-partner?: No    Within the last year, have you been humiliated or emotionally abused in other ways by your partner or ex-partner?: No    Within the last year, have you been kicked, hit, slapped, or otherwise physically hurt by your partner or ex-partner?: No    Within the last year, have you been raped or forced to have any kind of sexual activity by your partner or ex-partner?: No    Family History  Problem Relation Age of Onset   Migraines Brother    Breast cancer Maternal Grandmother     Current Outpatient Medications  Medication Sig Dispense Refill   aspirin  EC 81 MG tablet Take 1 tablet (81 mg total) by mouth daily. Swallow whole.     busPIRone (BUSPAR) 7.5 MG tablet Take 15 mg by mouth 2 (two) times daily.     estradiol (CLIMARA - DOSED IN MG/24  HR) 0.05 mg/24hr patch Place 0.05 mg onto the skin once a week.     fluocinonide cream (LIDEX) 0.05 % Apply 1 application topically daily as needed (eczema).     FLUoxetine (PROZAC) 20 MG capsule Take 20 mg by mouth daily.     fluticasone (FLONASE) 50 MCG/ACT nasal spray Place 2 sprays into both nostrils daily as needed for allergies or rhinitis.     lisinopril  (ZESTRIL ) 2.5 MG tablet Take 1 tablet (2.5 mg total) by mouth daily. 90 tablet 3   Magnesium Oxide 250 MG TABS Take 1 tablet by mouth at bedtime.     Omega-3 1000 MG CAPS Take 1 capsule by mouth daily.  OVER THE COUNTER MEDICATION Apply 1 Application topically as needed (rash).     OVER THE COUNTER MEDICATION Take 1 tablet by mouth 4 (four) times daily. Nutrafol Balance vitamins for hair loss     Probiotic Product (PROBIOTIC DAILY PO) Take 200 mg by mouth daily. Culture Inulin Probiotic     progesterone (PROMETRIUM) 200 MG capsule Take 200 mg by mouth daily.     traZODone (DESYREL) 100 MG tablet Take 75 mg by mouth at bedtime.     cyanocobalamin (VITAMIN B12) 1000 MCG tablet Take 1,000 mcg by mouth daily.     pantoprazole (PROTONIX) 40 MG tablet Take 40 mg by mouth daily.     PARAGARD INTRAUTERINE COPPER IUD IUD 1 each by Intrauterine route once. Inserted 2 years ago (Patient not taking: Reported on 01/11/2024)     phentermine 15 MG capsule Take 15 mg by mouth daily. (Patient not taking: Reported on 01/11/2024)     No current facility-administered medications for this visit.   Facility-Administered Medications Ordered in Other Visits  Medication Dose Route Frequency Provider Last Rate Last Admin   gadopentetate dimeglumine  (MAGNEVIST ) injection 18 mL  18 mL Intravenous Once PRN Ines Onetha NOVAK, MD        No Known Allergies  REVIEW OF SYSTEMS (negative unless checked):   Cardiac:  []  Chest pain or chest pressure? []  Shortness of breath upon activity? []  Shortness of breath when lying flat? []  Irregular heart rhythm?  Vascular:   []  Pain in calf, thigh, or hip brought on by walking? []  Pain in feet at night that wakes you up from your sleep? []  Blood clot in your veins? [x]  Leg swelling?  Pulmonary:  []  Oxygen at home? []  Productive cough? []  Wheezing?  Neurologic:  []  Sudden weakness in arms or legs? []  Sudden numbness in arms or legs? []  Sudden onset of difficult speaking or slurred speech? []  Temporary loss of vision in one eye? []  Problems with dizziness?  Gastrointestinal:  []  Blood in stool? []  Vomited blood?  Genitourinary:  []  Burning when urinating? []  Blood in urine?  Psychiatric:  []  Major depression  Hematologic:  []  Bleeding problems? []  Problems with blood clotting?  Dermatologic:  []  Rashes or ulcers?  Constitutional:  []  Fever or chills?  Ear/Nose/Throat:  []  Change in hearing? []  Nose bleeds? []  Sore throat?  Musculoskeletal:  []  Back pain? []  Joint pain? []  Muscle pain?   Physical Examination     Vitals:   03/05/24 0823  BP: 122/78  Pulse: 80  Temp: 98.3 F (36.8 C)  TempSrc: Temporal  SpO2: 95%  Weight: 193 lb 9.6 oz (87.8 kg)  Height: 5' 5 (1.651 m)   Body mass index is 32.22 kg/m.  General:  WDWN in NAD; vital signs documented above Gait: Not observed HENT: WNL, normocephalic Pulmonary: normal non-labored breathing , without wheezing Cardiac: regular HR Abdomen: soft, NT, no masses Extremities: without varicose veins, without reticular veins, with edema of left foot, without stasis pigmentation, without lipodermatosclerosis, without ulcers Musculoskeletal: no muscle wasting or atrophy  Neurologic: A&O X 3;  No focal weakness or paresthesias are detected Psychiatric:  The pt has Normal affect.  Non-invasive Vascular Imaging   BLE Venous Insufficiency Duplex (01/14/24):  LLE: No DVT and SVT GSV reflux SFJ to mid calf, non visualized in prox to mid thigh GSV diameter 0.39-0.44 cm No SSV reflux  No deep venous reflux   Medical Decision  Making   Katherine Leon is a 53 y.o.  female who presents with: LLE chronic venous insufficiency with swelling. Duplex shows not DVT or SVT. She does have superficial venous reflux in her GSV throughout most of thigh and calf. No SSV reflux or deep Reflux. Based on her duplex she would be a candidate for venous lazer ablation.  Based on the patient's history and examination, I recommend: daily elevation of 20-30 minutes above level of heart, daily compression stocking use, exercise, weight reduction, refraining from prolonged sitting or standing. I discussed with the patient the use of her 20-30 mm thigh high compression stockings and need for 3 month trial of such. She is not interested in an ablation at this time so she was measured and fitted for 20-30 mmHg knee high compression stockings at today's visit Patient was provided with information about vein health She will follow up as needed if she has any new or worsening symptoms   Teretha Damme, PA-C [facility] Office: [phone]  03/05/2024, 8:50 AM  Clinic MD: Sheree

## 2024-04-17 ENCOUNTER — Encounter: Payer: Self-pay | Admitting: Podiatry

## 2024-04-19 NOTE — Telephone Encounter (Signed)
Can we schedule her a follow up? Thanks!

## 2024-04-24 ENCOUNTER — Encounter: Payer: Self-pay | Admitting: Podiatry

## 2024-04-24 ENCOUNTER — Ambulatory Visit (INDEPENDENT_AMBULATORY_CARE_PROVIDER_SITE_OTHER)

## 2024-04-24 ENCOUNTER — Ambulatory Visit: Admitting: Podiatry

## 2024-04-24 VITALS — Ht 65.0 in | Wt 193.0 lb

## 2024-04-24 DIAGNOSIS — M7989 Other specified soft tissue disorders: Secondary | ICD-10-CM | POA: Diagnosis not present

## 2024-04-24 DIAGNOSIS — Q666 Other congenital valgus deformities of feet: Secondary | ICD-10-CM

## 2024-04-24 DIAGNOSIS — M7752 Other enthesopathy of left foot: Secondary | ICD-10-CM | POA: Diagnosis not present

## 2024-04-24 DIAGNOSIS — M79672 Pain in left foot: Secondary | ICD-10-CM | POA: Diagnosis not present

## 2024-04-24 DIAGNOSIS — M778 Other enthesopathies, not elsewhere classified: Secondary | ICD-10-CM

## 2024-04-24 MED ORDER — MELOXICAM 7.5 MG PO TABS: 7.5000 mg | ORAL_TABLET | Freq: Every day | ORAL | 0 refills | Status: DC | PRN

## 2024-04-24 NOTE — Patient Instructions (Signed)
 Meloxicam Tablets What is this medication? MELOXICAM (mel OX i cam) treats mild to moderate pain, inflammation, or arthritis. It works by decreasing inflammation. It belongs to a group of medications called NSAIDs. This medicine may be used for other purposes; ask your health care provider or pharmacist if you have questions. COMMON BRAND NAME(S): Mobic What should I tell my care team before I take this medication? They need to know if you have any of these conditions: Asthma (lung or breathing disease) Bleeding disorder Coronary artery bypass graft (CABG) within the past 2 weeks Dehydration Frequently drink alcohol Heart attack Heart disease Heart failure High blood pressure Kidney disease Liver disease Stomach bleeding Stomach ulcers, other stomach or intestine problems Take medications that treat or prevent blood clots Taking other steroids, such as dexamethasone or prednisone Tobacco use An unusual or allergic reaction to meloxicam, other medications, foods, dyes, or preservatives Pregnant or trying to get pregnant Breast-feeding How should I use this medication? Take this medication by mouth. Take it as directed on the prescription label at the same time every day. You can take it with or without food. If it upsets your stomach, take it with food. Do not use it more often than directed. There may be unused or extra doses in the bottle after you finish your treatment. Talk to your care team if you have questions about your dose. A special MedGuide will be given to you by the pharmacist with each prescription and refill. Be sure to read this information carefully each time. Talk to your care team about the use of this medication in children. Special care may be needed. People over 49 years of age may have a stronger reaction and need a smaller dose. Overdosage: If you think you have taken too much of this medicine contact a poison control center or emergency room at once. NOTE:  This medicine is only for you. Do not share this medicine with others. What if I miss a dose? If you miss a dose, take it as soon as you can. If it is almost time for your next dose, take only that dose. Do not take double or extra doses. What may interact with this medication? Do not take this medication with any of the following: Cidofovir Ketorolac This medication may also interact with the following: Alcohol Aspirin and aspirin-like medications Certain medications for blood pressure, heart disease, irregular heart beat Certain medications for mental health conditions Certain medications that treat or prevent blood clots, such as warfarin, enoxaparin, dalteparin, apixaban, dabigatran, rivaroxaban Cyclosporine Diuretics Fluconazole Lithium Methotrexate Other NSAIDs, medications for pain and inflammation, such as ibuprofen and naproxen Pemetrexed This list may not describe all possible interactions. Give your health care provider a list of all the medicines, herbs, non-prescription drugs, or dietary supplements you use. Also tell them if you smoke, drink alcohol, or use illegal drugs. Some items may interact with your medicine. What should I watch for while using this medication? Visit your care team for regular checks on your progress. Tell your care team if your symptoms do not start to get better or if they get worse. Do not take other medications that contain aspirin, ibuprofen, or naproxen with this medication. Side effects such as stomach upset, nausea, or ulcers may be more likely to occur. Many non-prescription medications contain aspirin, ibuprofen, or naproxen. Always read labels carefully. This medication can cause serious ulcers and bleeding in the stomach. It can happen with no warning. Tobacco, alcohol, older age, and poor health  can also increase risks. Call your care team right away if you have stomach pain or blood in your vomit or stool. This medication does not prevent a  heart attack or stroke. This medication may increase the chance of a heart attack or stroke. The chance may increase the longer you use this medication or if you have heart disease. If you take aspirin to prevent a heart attack or stroke, talk to your care team about using this medication. This medication may cause serious skin reactions. They can happen weeks to months after starting the medication. Contact your care team right away if you notice fevers or flu-like symptoms with a rash. The rash may be red or purple and then turn into blisters or peeling of the skin. Or, you might notice a red rash with swelling of the face, lips or lymph nodes in your neck or under your arms. Talk to your care team if you wish to become pregnant or think you might be pregnant. This medication can cause serious birth defects. This medication may affect your coordination, reaction time, or judgment. Do not drive or operate machinery until you know how this medication affects you. Sit up or stand slowly to reduce the risk of dizzy or fainting spells. Drinking alcohol with this medication can increase the risk of these side effects. Be careful brushing or flossing your teeth or using a toothpick because you may get an infection or bleed more easily. If you have any dental work done, tell your dentist you are receiving this medication. This medication may make it more difficult to get pregnant. Talk to your care team if you are concerned about your fertility. What side effects may I notice from receiving this medication? Side effects that you should report to your care team as soon as possible: Allergic reactions--skin rash, itching, hives, swelling of the face, lips, tongue, or throat Bleeding--bloody or black, tar-like stools, vomiting blood or brown material that looks like coffee grounds, red or dark brown urine, small red or purple spots on skin, unusual bruising or bleeding Heart attack--pain or tightness in the chest,  shoulders, arms, or jaw, nausea, shortness of breath, cold or clammy skin, feeling faint or lightheaded Heart failure--shortness of breath, swelling of the ankles, feet, or hands, sudden weight gain, unusual weakness or fatigue Increase in blood pressure Kidney injury--decrease in the amount of urine, swelling of the ankles, hands, or feet Liver injury--right upper belly pain, loss of appetite, nausea, light-colored stool, dark yellow or brown urine, yellowing skin or eyes, unusual weakness or fatigue Rash, fever, and swollen lymph nodes Redness, blistering, peeling, or loosening of the skin, including inside the mouth Round red or dark patches on the skin that may itch, burn, and blister Stroke--sudden numbness or weakness of the face, arm, or leg, trouble speaking, confusion, trouble walking, loss of balance or coordination, dizziness, severe headache, change in vision Side effects that usually do not require medical attention (report these to your care team if they continue or are bothersome): Headache Loss of appetite Nausea Upset stomach This list may not describe all possible side effects. Call your doctor for medical advice about side effects. You may report side effects to FDA at 1-800-FDA-1088. Where should I keep my medication? Keep out of the reach of children and pets. Store at room temperature between 20 and 25 degrees C (68 and 77 degrees F). Protect from moisture. Keep the container tightly closed. Get rid of any unused medication after the expiration date.  To get rid of medications that are no longer needed or have expired: Take the medication to a medication take-back program. Check with your pharmacy or law enforcement to find a location. If you cannot return the medication, check the label or package insert to see if the medication should be thrown out in the garbage or flushed down the toilet. If you are not sure, ask your care team. If it is safe to put it in the trash,  empty the medication out of the container. Mix the medication with cat litter, dirt, coffee grounds, or other unwanted substance. Seal the mixture in a bag or container. Put it in the trash. NOTE: This sheet is a summary. It may not cover all possible information. If you have questions about this medicine, talk to your doctor, pharmacist, or health care provider.  2025 [employer] (2023-08-31 00:00:00)

## 2024-04-24 NOTE — Progress Notes (Signed)
 Subjective: Chief Complaint  Patient presents with   Foot Pain    Pt is here to f/u on left foot, she states that she went to have vein study for the leg that showed insuffiencey circulation to the veins, she wants to discuss what the next steps should be.   53 year old female presents the office with above concerns.  She states that overall the swelling in part has been doing better since using the compression socks but she still does get discomfort foot which is likely a separate issue.  She does have a flatfoot and she gets discomfort more the dorsal, lateral aspect of the foot that she points to.  Does not report any injuries recently but she does have a history of a fracture to the left foot proximal but she points.   Objective: AAO x3, NAD DP/PT pulses palpable bilaterally, CRT less than 3 seconds There is decreased medial arch upon weightbearing.  There is tenderness palpation along the dorsal mostly along the Lisfranc joint laterally.  There is no area pinpoint tenderness.  There is chronic edema present but appears to be improving.  There is no erythema or warmth. Flexor, extensor tendons intact.  MMT 5/5. No pain with calf compression, swelling, warmth, erythema  Assessment: Capsulitis left foot; edema  Plan: -All treatment options discussed with the patient including all alternatives, risks, complications.  -X-rays obtained and reviewed.  Multiple views obtained.  Likely old fracture noted to the cuboid as there is a sclerotic line.  There is some joint space narrowing noted along the joint. -I do think some of the foot symptoms are multifactorial.  I think this is coming from her is also history of previous fracture and I think this is.  There are some arthritic changes present at the fifth metatarsal cuboid joint on x-ray possibly this could be contribute to her symptoms as well. - Dispensed power steps to help support the flatfoot. -Prescribed mobic . Discussed side effects of the  medication and directed to stop if any are to occur and call the office.  She is aware of the risks of anti-inflammatories. -Offered steroid injection if needed. -Patient encouraged to call the office with any questions, concerns, change in symptoms.   Katherine Leon DPM

## 2024-05-20 ENCOUNTER — Other Ambulatory Visit: Payer: Self-pay | Admitting: Podiatry
